# Patient Record
Sex: Female | Born: 1960
Health system: Southern US, Community
[De-identification: ages and names within clinical notes are randomized; demographics above are authoritative.]

## PROBLEM LIST (undated history)

## (undated) DIAGNOSIS — I10 Essential (primary) hypertension: Secondary | ICD-10-CM

## (undated) DIAGNOSIS — R0602 Shortness of breath: Secondary | ICD-10-CM

## (undated) DIAGNOSIS — E7800 Pure hypercholesterolemia, unspecified: Secondary | ICD-10-CM

## (undated) DIAGNOSIS — E039 Hypothyroidism, unspecified: Secondary | ICD-10-CM

## (undated) DIAGNOSIS — N979 Female infertility, unspecified: Secondary | ICD-10-CM

## (undated) DIAGNOSIS — L300 Nummular dermatitis: Secondary | ICD-10-CM

## (undated) HISTORY — DX: Shortness of breath: R06.02

## (undated) HISTORY — DX: Pure hypercholesterolemia, unspecified: E78.00

## (undated) HISTORY — DX: Nummular dermatitis: L30.0

## (undated) HISTORY — DX: Essential (primary) hypertension: I10

## (undated) HISTORY — DX: Female infertility, unspecified: N97.9

## (undated) HISTORY — DX: Hypothyroidism, unspecified: E03.9

## (undated) HISTORY — PX: MICROTUBOPLASTY: SHX5401

---

## 1988-12-13 HISTORY — PX: LAPAROSCOPY: SHX197

## 2016-01-09 ENCOUNTER — Other Ambulatory Visit (HOSPITAL_COMMUNITY)
Admission: RE | Admit: 2016-01-09 | Discharge: 2016-01-09 | Disposition: A | Discharge status: Home / Self Care | Payer: 59 | Source: Ambulatory Visit | Attending: Family Medicine | Admitting: Family Medicine

## 2016-01-09 DIAGNOSIS — Z1151 Encounter for screening for human papillomavirus (HPV): Secondary | ICD-10-CM | POA: Diagnosis not present

## 2016-01-09 DIAGNOSIS — Z124 Encounter for screening for malignant neoplasm of cervix: Secondary | ICD-10-CM | POA: Diagnosis present

## 2016-10-20 ENCOUNTER — Other Ambulatory Visit: Payer: Self-pay | Admitting: Family Medicine

## 2016-10-20 DIAGNOSIS — Z1231 Encounter for screening mammogram for malignant neoplasm of breast: Secondary | ICD-10-CM

## 2016-11-24 ENCOUNTER — Ambulatory Visit
Admission: RE | Admit: 2016-11-24 | Discharge: 2016-11-24 | Disposition: A | Discharge status: Home / Self Care | Payer: 59 | Source: Ambulatory Visit | Attending: Family Medicine | Admitting: Family Medicine

## 2016-11-24 DIAGNOSIS — Z1231 Encounter for screening mammogram for malignant neoplasm of breast: Secondary | ICD-10-CM

## 2018-02-10 ENCOUNTER — Other Ambulatory Visit: Payer: Self-pay | Admitting: Family Medicine

## 2018-05-10 DIAGNOSIS — Z780 Asymptomatic menopausal state: Secondary | ICD-10-CM | POA: Diagnosis not present

## 2018-05-10 LAB — HM DEXA SCAN: HM Dexa Scan: NORMAL

## 2018-07-13 DIAGNOSIS — E785 Hyperlipidemia, unspecified: Secondary | ICD-10-CM | POA: Diagnosis not present

## 2018-07-13 DIAGNOSIS — E039 Hypothyroidism, unspecified: Secondary | ICD-10-CM | POA: Diagnosis not present

## 2018-10-25 DIAGNOSIS — Z1231 Encounter for screening mammogram for malignant neoplasm of breast: Secondary | ICD-10-CM | POA: Diagnosis not present

## 2019-02-01 DIAGNOSIS — E039 Hypothyroidism, unspecified: Secondary | ICD-10-CM | POA: Diagnosis not present

## 2019-02-01 DIAGNOSIS — E785 Hyperlipidemia, unspecified: Secondary | ICD-10-CM | POA: Diagnosis not present

## 2019-02-01 DIAGNOSIS — I10 Essential (primary) hypertension: Secondary | ICD-10-CM | POA: Diagnosis not present

## 2019-02-09 DIAGNOSIS — Z0001 Encounter for general adult medical examination with abnormal findings: Secondary | ICD-10-CM | POA: Diagnosis not present

## 2019-02-09 DIAGNOSIS — I10 Essential (primary) hypertension: Secondary | ICD-10-CM | POA: Diagnosis not present

## 2019-02-09 DIAGNOSIS — E785 Hyperlipidemia, unspecified: Secondary | ICD-10-CM | POA: Diagnosis not present

## 2019-02-09 DIAGNOSIS — E039 Hypothyroidism, unspecified: Secondary | ICD-10-CM | POA: Diagnosis not present

## 2019-04-06 DIAGNOSIS — E785 Hyperlipidemia, unspecified: Secondary | ICD-10-CM | POA: Diagnosis not present

## 2019-06-07 DIAGNOSIS — L300 Nummular dermatitis: Secondary | ICD-10-CM | POA: Diagnosis not present

## 2019-06-07 DIAGNOSIS — L989 Disorder of the skin and subcutaneous tissue, unspecified: Secondary | ICD-10-CM | POA: Diagnosis not present

## 2019-06-07 DIAGNOSIS — L821 Other seborrheic keratosis: Secondary | ICD-10-CM | POA: Diagnosis not present

## 2019-06-07 DIAGNOSIS — D485 Neoplasm of uncertain behavior of skin: Secondary | ICD-10-CM | POA: Diagnosis not present

## 2019-06-07 DIAGNOSIS — L658 Other specified nonscarring hair loss: Secondary | ICD-10-CM | POA: Diagnosis not present

## 2019-07-09 DIAGNOSIS — L300 Nummular dermatitis: Secondary | ICD-10-CM | POA: Diagnosis not present

## 2019-07-12 DIAGNOSIS — E785 Hyperlipidemia, unspecified: Secondary | ICD-10-CM | POA: Diagnosis not present

## 2019-07-12 DIAGNOSIS — I10 Essential (primary) hypertension: Secondary | ICD-10-CM | POA: Diagnosis not present

## 2019-07-12 DIAGNOSIS — E039 Hypothyroidism, unspecified: Secondary | ICD-10-CM | POA: Diagnosis not present

## 2019-09-04 DIAGNOSIS — E785 Hyperlipidemia, unspecified: Secondary | ICD-10-CM | POA: Diagnosis not present

## 2019-09-04 DIAGNOSIS — E039 Hypothyroidism, unspecified: Secondary | ICD-10-CM | POA: Diagnosis not present

## 2019-09-07 DIAGNOSIS — E669 Obesity, unspecified: Secondary | ICD-10-CM | POA: Diagnosis not present

## 2019-09-07 DIAGNOSIS — E785 Hyperlipidemia, unspecified: Secondary | ICD-10-CM | POA: Diagnosis not present

## 2019-09-07 DIAGNOSIS — E039 Hypothyroidism, unspecified: Secondary | ICD-10-CM | POA: Diagnosis not present

## 2019-09-07 DIAGNOSIS — I10 Essential (primary) hypertension: Secondary | ICD-10-CM | POA: Diagnosis not present

## 2019-09-18 DIAGNOSIS — Z23 Encounter for immunization: Secondary | ICD-10-CM | POA: Diagnosis not present

## 2020-02-11 DIAGNOSIS — E039 Hypothyroidism, unspecified: Secondary | ICD-10-CM | POA: Diagnosis not present

## 2020-02-11 DIAGNOSIS — I10 Essential (primary) hypertension: Secondary | ICD-10-CM | POA: Diagnosis not present

## 2020-02-15 ENCOUNTER — Other Ambulatory Visit: Payer: Self-pay | Admitting: Family Medicine

## 2020-02-15 DIAGNOSIS — N644 Mastodynia: Secondary | ICD-10-CM

## 2020-02-15 DIAGNOSIS — M722 Plantar fascial fibromatosis: Secondary | ICD-10-CM | POA: Diagnosis not present

## 2020-02-15 DIAGNOSIS — H9193 Unspecified hearing loss, bilateral: Secondary | ICD-10-CM | POA: Diagnosis not present

## 2020-02-15 DIAGNOSIS — Z0000 Encounter for general adult medical examination without abnormal findings: Secondary | ICD-10-CM | POA: Diagnosis not present

## 2020-02-15 DIAGNOSIS — I10 Essential (primary) hypertension: Secondary | ICD-10-CM | POA: Diagnosis not present

## 2020-02-28 ENCOUNTER — Ambulatory Visit
Admission: RE | Admit: 2020-02-28 | Discharge: 2020-02-28 | Disposition: A | Discharge status: Home / Self Care | Payer: 59 | Source: Ambulatory Visit | Attending: Family Medicine | Admitting: Family Medicine

## 2020-02-28 ENCOUNTER — Ambulatory Visit: Payer: 59

## 2020-02-28 ENCOUNTER — Other Ambulatory Visit: Payer: Self-pay

## 2020-02-28 DIAGNOSIS — R928 Other abnormal and inconclusive findings on diagnostic imaging of breast: Secondary | ICD-10-CM | POA: Diagnosis not present

## 2020-02-28 DIAGNOSIS — N644 Mastodynia: Secondary | ICD-10-CM

## 2020-07-03 DIAGNOSIS — K047 Periapical abscess without sinus: Secondary | ICD-10-CM | POA: Diagnosis not present

## 2020-08-14 DIAGNOSIS — E785 Hyperlipidemia, unspecified: Secondary | ICD-10-CM | POA: Diagnosis not present

## 2020-08-14 DIAGNOSIS — E039 Hypothyroidism, unspecified: Secondary | ICD-10-CM | POA: Diagnosis not present

## 2020-08-14 DIAGNOSIS — I10 Essential (primary) hypertension: Secondary | ICD-10-CM | POA: Diagnosis not present

## 2020-08-22 DIAGNOSIS — Z23 Encounter for immunization: Secondary | ICD-10-CM | POA: Diagnosis not present

## 2020-08-22 DIAGNOSIS — I10 Essential (primary) hypertension: Secondary | ICD-10-CM | POA: Diagnosis not present

## 2020-08-22 DIAGNOSIS — E039 Hypothyroidism, unspecified: Secondary | ICD-10-CM | POA: Diagnosis not present

## 2020-08-22 DIAGNOSIS — E785 Hyperlipidemia, unspecified: Secondary | ICD-10-CM | POA: Diagnosis not present

## 2020-12-18 ENCOUNTER — Encounter (INDEPENDENT_AMBULATORY_CARE_PROVIDER_SITE_OTHER): Payer: Self-pay | Admitting: Family Medicine

## 2020-12-18 ENCOUNTER — Ambulatory Visit (INDEPENDENT_AMBULATORY_CARE_PROVIDER_SITE_OTHER): Payer: BC Managed Care – PPO | Admitting: Family Medicine

## 2020-12-18 ENCOUNTER — Other Ambulatory Visit: Payer: Self-pay

## 2020-12-18 VITALS — BP 130/83 | HR 72 | Temp 98.2°F | Ht 60.0 in | Wt 203.0 lb

## 2020-12-18 DIAGNOSIS — R5383 Other fatigue: Secondary | ICD-10-CM | POA: Insufficient documentation

## 2020-12-18 DIAGNOSIS — E7849 Other hyperlipidemia: Secondary | ICD-10-CM

## 2020-12-18 DIAGNOSIS — Z9189 Other specified personal risk factors, not elsewhere classified: Secondary | ICD-10-CM

## 2020-12-18 DIAGNOSIS — Z6839 Body mass index (BMI) 39.0-39.9, adult: Secondary | ICD-10-CM

## 2020-12-18 DIAGNOSIS — R0602 Shortness of breath: Secondary | ICD-10-CM | POA: Diagnosis not present

## 2020-12-18 DIAGNOSIS — Z1331 Encounter for screening for depression: Secondary | ICD-10-CM

## 2020-12-18 DIAGNOSIS — Z0289 Encounter for other administrative examinations: Secondary | ICD-10-CM

## 2020-12-18 DIAGNOSIS — E559 Vitamin D deficiency, unspecified: Secondary | ICD-10-CM | POA: Diagnosis not present

## 2020-12-18 DIAGNOSIS — E039 Hypothyroidism, unspecified: Secondary | ICD-10-CM | POA: Insufficient documentation

## 2020-12-18 DIAGNOSIS — I10 Essential (primary) hypertension: Secondary | ICD-10-CM

## 2020-12-18 DIAGNOSIS — E038 Other specified hypothyroidism: Secondary | ICD-10-CM

## 2020-12-18 DIAGNOSIS — E8881 Metabolic syndrome: Secondary | ICD-10-CM | POA: Diagnosis not present

## 2020-12-19 ENCOUNTER — Encounter (INDEPENDENT_AMBULATORY_CARE_PROVIDER_SITE_OTHER): Payer: Self-pay | Admitting: Family Medicine

## 2020-12-19 LAB — COMPREHENSIVE METABOLIC PANEL
ALT: 54 IU/L — ABNORMAL HIGH (ref 0–32)
AST: 104 IU/L — ABNORMAL HIGH (ref 0–40)
Albumin/Globulin Ratio: 1.4 (ref 1.2–2.2)
Albumin: 4.7 g/dL (ref 3.8–4.9)
Alkaline Phosphatase: 96 IU/L (ref 44–121)
BUN/Creatinine Ratio: 17 (ref 9–23)
BUN: 12 mg/dL (ref 6–24)
Bilirubin Total: 0.5 mg/dL (ref 0.0–1.2)
CO2: 22 mmol/L (ref 20–29)
Calcium: 10 mg/dL (ref 8.7–10.2)
Chloride: 102 mmol/L (ref 96–106)
Creatinine, Ser: 0.69 mg/dL (ref 0.57–1.00)
GFR calc Af Amer: 112 mL/min/{1.73_m2} (ref >59)
GFR calc non Af Amer: 97 mL/min/{1.73_m2} (ref >59)
Globulin, Total: 3.4 g/dL (ref 1.5–4.5)
Glucose: 92 mg/dL (ref 65–99)
Potassium: 4.2 mmol/L (ref 3.5–5.2)
Sodium: 139 mmol/L (ref 134–144)
Total Protein: 8.1 g/dL (ref 6.0–8.5)

## 2020-12-19 LAB — CBC WITH DIFFERENTIAL/PLATELET
Basophils Absolute: 0 10*3/uL (ref 0.0–0.2)
Basos: 1 %
EOS (ABSOLUTE): 0.1 10*3/uL (ref 0.0–0.4)
Eos: 2 %
Hematocrit: 42.6 % (ref 34.0–46.6)
Hemoglobin: 14.9 g/dL (ref 11.1–15.9)
Immature Grans (Abs): 0 10*3/uL (ref 0.0–0.1)
Immature Granulocytes: 0 %
Lymphocytes Absolute: 2.1 10*3/uL (ref 0.7–3.1)
Lymphs: 35 %
MCH: 31.7 pg (ref 26.6–33.0)
MCHC: 35 g/dL (ref 31.5–35.7)
MCV: 91 fL (ref 79–97)
Monocytes Absolute: 0.4 10*3/uL (ref 0.1–0.9)
Monocytes: 6 %
Neutrophils Absolute: 3.3 10*3/uL (ref 1.4–7.0)
Neutrophils: 56 %
Platelets: 280 10*3/uL (ref 150–450)
RBC: 4.7 x10E6/uL (ref 3.77–5.28)
RDW: 13 % (ref 11.7–15.4)
WBC: 5.9 10*3/uL (ref 3.4–10.8)

## 2020-12-19 LAB — VITAMIN B12: Vitamin B-12: 802 pg/mL (ref 232–1245)

## 2020-12-19 LAB — LIPID PANEL
Chol/HDL Ratio: 2.9 ratio (ref 0.0–4.4)
Cholesterol, Total: 215 mg/dL — ABNORMAL HIGH (ref 100–199)
HDL: 75 mg/dL (ref >39)
LDL Chol Calc (NIH): 126 mg/dL — ABNORMAL HIGH (ref 0–99)
Triglycerides: 82 mg/dL (ref 0–149)
VLDL Cholesterol Cal: 14 mg/dL (ref 5–40)

## 2020-12-19 LAB — HEMOGLOBIN A1C
Est. average glucose Bld gHb Est-mCnc: 111 mg/dL
Hgb A1c MFr Bld: 5.5 % (ref 4.8–5.6)

## 2020-12-19 LAB — T4, FREE: Free T4: 1.4 ng/dL (ref 0.82–1.77)

## 2020-12-19 LAB — TSH: TSH: 7.25 u[IU]/mL — ABNORMAL HIGH (ref 0.450–4.500)

## 2020-12-19 LAB — INSULIN, RANDOM: INSULIN: 8.6 u[IU]/mL (ref 2.6–24.9)

## 2020-12-19 LAB — VITAMIN D 25 HYDROXY (VIT D DEFICIENCY, FRACTURES): Vit D, 25-Hydroxy: 35.7 ng/mL (ref 30.0–100.0)

## 2020-12-19 LAB — FOLATE: Folate: 12.5 ng/mL (ref >3.0)

## 2020-12-19 LAB — T3: T3, Total: 100 ng/dL (ref 71–180)

## 2020-12-23 NOTE — Progress Notes (Addendum)
Dear Dr. Gweneth Gonzales,   Thank you for referring Sylvia Gonzales to our clinic. The following note includes my evaluation and treatment recommendations.  Chief Complaint:   OBESITY Sylvia Gonzales (MR# [id number]) is a 60 y.o. female who presents for evaluation and treatment of obesity and related comorbidities. Current BMI is Body mass index is 39.65 kg/m. Sylvia Gonzales has been struggling with her weight for many years and has been unsuccessful in either losing weight, maintaining weight loss, or reaching her healthy weight goal.  Sylvia Gonzales is currently in the action stage of change and ready to dedicate time achieving and maintaining a healthier weight. Sylvia Gonzales is interested in becoming our patient and working on intensive lifestyle modifications including (but not limited to) diet and exercise for weight loss.  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 Her husband works from home, and she says her #1 job is to take care of him and the house.  Sylvia Gonzales's habits were reviewed today and are as follows: Her family eats meals together, she thinks her family will eat healthier with her, her desired weight loss is 53 lbs, she has been heavy most of her life, she started gaining weight in her late 20's, her heaviest weight ever was 207 pounds, she has significant food cravings issues, she snacks frequently in the evenings, she skips meals frequently, she is frequently drinking liquids with calories, she frequently makes poor food choices and she struggles with emotional eating.  This is the patient's first visit at [facility].  The patient's NEW PATIENT PACKET that they filled out prior to today's office visit was reviewed at length and information from that paperwork was included within the following office visit note.    Included in the packet: current and past health history, medications, allergies, ROS, gynecologic history (women  only), surgical history, family history, social history, weight history, weight loss surgery history (for those that have had weight loss surgery), nutritional evaluation, mood and food questionnaire along with a depression screening (PHQ9) on all patients, an Epworth questionnaire, sleep habits questionnaire, patient life and health improvement goals questionnaire. These will all be scanned into the patient's chart under media.   During the visit, I independently reviewed the patient's EKG, bioimpedance scale results, and indirect calorimeter results. I used this information to tailor a meal plan for the patient that will help Sylvia Gonzales to lose weight and will improve her obesity-related conditions going forward.  I performed a medically necessary appropriate examination and/or evaluation. I discussed the assessment and treatment plan with the patient. The patient was provided an opportunity to ask questions and all were answered. The patient agreed with the plan and demonstrated an understanding of the instructions. Labs were ordered today (unless patient declined them) and will be reviewed with the patient at our next visit unless more critical results need to be addressed immediately. Clinical information was updated and documented in the EMR.  Time spent on visit including pre-visit chart review and post-visit care was estimated to be 60 minutes.  A separate 15 minutes was spent on risk counseling (see above/below).   Depression Screen Sylvia Gonzales's Food and Mood (modified PHQ-9) score was 2.  Depression screen Sylvia Gonzales 2/9 12/18/2020  Decreased Interest 0  Down, Depressed, Hopeless 0  PHQ - 2 Score 0  Altered sleeping 0  Tired, decreased energy 1  Change in appetite 1  Feeling bad or failure about yourself  0  Trouble concentrating 0  Moving slowly or fidgety/restless 0  Suicidal thoughts 0  PHQ-9 Score 2    Assessment/Plan:   1. Other fatigue Sylvia Gonzales admits to daytime somnolence and admits to waking up  still tired. Patent has a history of symptoms of daytime fatigue. Sylvia Gonzales generally gets 8 hours of sleep per night, and states that she has generally restful sleep. Snoring is present. Apneic episodes are not present. Epworth Sleepiness Score is 5.  Plan: [name] does feel that her weight is causing her energy to be lower than it should be. Fatigue may be related to obesity, depression or many other causes. Labs will be ordered, and in the meanwhile, [name] will focus on self care including making healthy food choices, increasing physical activity and focusing on stress reduction. Check labs today.  Orders - EKG 12-Lead - Vitamin B12 - CBC with Differential/Platelet - Comprehensive metabolic panel - Folate - Hemoglobin A1c - Insulin, random - Lipid panel - T3 - T4, free - TSH - VITAMIN D 25 Hydroxy (Vit-D Deficiency, Fractures)  2. SOBOE (shortness of breath on exertion) Sylvia Gonzales notes increasing shortness of breath with exercising and seems to be worsening over time with weight gain. She notes getting out of breath sooner with activity than she used to. This has gotten worse recently. Sylvia Gonzales denies shortness of breath at rest or orthopnea.  [name] does feel that she gets out of breath more easily that she used to when she exercises. [name]'s shortness of breath appears to be obesity related and exercise induced. She has agreed to work on weight loss and gradually increase exercise to treat her exercise induced shortness of breath. Will continue to monitor closely. Check labs today.  Orders - Vitamin B12 - CBC with Differential/Platelet - Comprehensive metabolic panel - Folate - Hemoglobin A1c - Insulin, random - Lipid panel - T3 - T4, free - TSH - VITAMIN D 25 Hydroxy (Vit-D Deficiency, Fractures)  3. Essential hypertension Sylvia Gonzales is prescribed candesartan 16 mg. Review: taking medications as instructed, no medication side effects noted, no chest pain on exertion, no dyspnea on exertion,  no swelling of ankles.   BP Readings from Last 3 Encounters:  01/01/21 134/83  12/18/20 130/83   Plan: Triva's blood pressure is essentially at goal. She is working on healthy weight loss and exercise to improve blood pressure control. We will watch for signs of hypotension as she continues her lifestyle modifications. Check labs today.  Orders - Vitamin B12 - CBC with Differential/Platelet - Comprehensive metabolic panel - Folate - Hemoglobin A1c - Insulin, random - Lipid panel - T3 - T4, free - TSH - VITAMIN D 25 Hydroxy (Vit-D Deficiency, Fractures)  4. Other hyperlipidemia Sylvia Gonzales is prescribed Lipitor 80 mg. She has hyperlipidemia and has been trying to improve her cholesterol levels with intensive lifestyle modification including a low saturated fat diet, exercise and weight loss. She denies any chest pain, claudication or myalgias.  Plan: Cardiovascular risk and specific lipid/LDL goals reviewed.  We discussed several lifestyle modifications today and Danilyn will continue to work on diet, exercise and weight loss efforts. Orders and follow up as documented in patient record. Check labs today.  Counseling Intensive lifestyle modifications are the first line treatment for this issue. . Dietary changes: Increase soluble fiber. Decrease simple carbohydrates. . Exercise changes: Moderate to vigorous-intensity aerobic activity 150 minutes per week if tolerated. . Lipid-lowering medications: see documented in medical record.   Orders - Vitamin B12 - CBC with Differential/Platelet - Comprehensive metabolic  panel - Folate - Hemoglobin A1c - Insulin, random - Lipid panel - T3 - T4, free - TSH - VITAMIN D 25 Hydroxy (Vit-D Deficiency, Fractures)  5. Other specified hypothyroidism The patient is prescribed Synthroid 112 mcg daily. Currently without concerns or symptoms.  Plan:  Continue current medication at current dose for now.   Check labs.  Orders and follow up as  documented in patient record.  Counseling . Good thyroid control is important for overall health. Supratherapeutic thyroid levels are dangerous and will not improve weight loss results. . Counseling: The correct way to take levothyroxine is fasting, with water, separated by at least 30 minutes from breakfast, and separated by more than 4 hours from calcium, iron, multivitamins, acid reflux medications (PPIs).   Orders - Vitamin B12 - CBC with Differential/Platelet - Comprehensive metabolic panel - Folate - Hemoglobin A1c - Insulin, random - Lipid panel - T3 - T4, free - TSH - VITAMIN D 25 Hydroxy (Vit-D Deficiency, Fractures)  6. Vitamin D deficiency Sylvia Gonzales is currently taking OTC vitamin D 1000 units each day. She denies nausea, vomiting or muscle weakness.  Plan: Low Vitamin D level contributes to fatigue and are associated with obesity, breast, and colon cancer.  Check labs today.  Orders - Vitamin B12 - CBC with Differential/Platelet - Comprehensive metabolic panel - Folate - Hemoglobin A1c - Insulin, random - Lipid panel - T3 - T4, free - TSH - VITAMIN D 25 Hydroxy (Vit-D Deficiency, Fractures)  7. At risk for diabetes mellitus - Sylvia Gonzales's father was diabetic around her age. Sylvia Gonzales was given extensive diabetes prevention education and counseling today of more than 28 minutes.  - Counseled patient on pathophysiology of disease and discussed various treatment options which always includes dietary and lifestyle modification as first line.   - Importance of healthy diet with very limited amounts of simple carbohydrates discussed with patient in addition to regular aerobic exercise to an eventual goal of 30min 5d/week or more.  - Handouts provided at patient's desire and or told to go online at the American Diabetes Association website for further information  8. Depression screening  Sylvia Gonzales had an essentially negative depression screening. Depression is commonly associated  with obesity and often results in emotional eating behaviors. We will monitor this closely and work to help improve the non-hunger eating patterns. Referral to Psychology may be required if no improvement is seen as she continues in our clinic.  9. Class 2 severe obesity with serious comorbidity and body mass index (BMI) of 39.0 to 39.9 in adult, unspecified obesity type (HCC) Sylvia Gonzales is currently in the action stage of change and her goal is to continue with weight loss efforts. I recommend Sylvia Gonzales begin the structured treatment plan as follows:  She has agreed to the Category 1 Plan.  Exercise goals: As is   Behavioral modification strategies: increasing lean protein intake, decreasing simple carbohydrates, increasing water intake, decreasing liquid calories (decrease alochol intake), meal planning and cooking strategies, keeping healthy foods in the home and planning for success.  She was informed of the importance of frequent follow-up visits to maximize her success with intensive lifestyle modifications for her multiple health conditions. She was informed we would discuss her lab results at her next visit unless there is a critical issue that needs to be addressed sooner. Sylvia Gonzales agreed to keep her next visit at the agreed upon time to discuss these results.  Objective:   Blood pressure 130/83, pulse 72, temperature 98.2 F (36.8 C),  height 5' (1.524 m), weight 203 lb (92.1 kg), SpO2 97 %. Body mass index is 39.65 kg/m.  EKG: Normal sinus rhythm, rate 76.  Indirect Calorimeter completed today shows a VO2 of 211 and a REE of 1473.  Her calculated basal metabolic rate is 2836 thus her basal metabolic rate is worse than expected.  General: Cooperative, alert, well developed, in no acute distress. HEENT: Conjunctivae and lids unremarkable. Cardiovascular: Regular rhythm.  Lungs: Normal work of breathing. Neurologic: No focal deficits.   Lab Results  Component Value Date   CREATININE 0.69  12/18/2020   BUN 12 12/18/2020   NA 139 12/18/2020   K 4.2 12/18/2020   CL 102 12/18/2020   CO2 22 12/18/2020   Lab Results  Component Value Date   ALT 54 (H) 12/18/2020   AST 104 (H) 12/18/2020   ALKPHOS 96 12/18/2020   BILITOT 0.5 12/18/2020   Lab Results  Component Value Date   HGBA1C 5.5 12/18/2020   Lab Results  Component Value Date   INSULIN 8.6 12/18/2020   Lab Results  Component Value Date   TSH 7.250 (H) 12/18/2020   Lab Results  Component Value Date   CHOL 215 (H) 12/18/2020   HDL 75 12/18/2020   LDLCALC 126 (H) 12/18/2020   TRIG 82 12/18/2020   CHOLHDL 2.9 12/18/2020   Lab Results  Component Value Date   WBC 5.9 12/18/2020   HGB 14.9 12/18/2020   HCT 42.6 12/18/2020   MCV 91 12/18/2020   PLT 280 12/18/2020    Attestation Statements:   Reviewed by clinician on day of visit: allergies, medications, problem list, medical history, surgical history, family history, social history, and previous encounter notes.  Edmund Hilda, am acting as Energy manager for [employer], DO.  I have reviewed the above documentation for accuracy and completeness, and I agree with the above. Carlye Grippe, D.O.  The 21st Century Cures Act was signed into law in 2016 which includes the topic of electronic health records.  This provides immediate access to information in MyChart.  This includes consultation notes, operative notes, office notes, lab results and pathology reports.  If you have any questions about what you read please let us know at your next visit so we can discuss your concerns and take corrective action if need be.  We are right here with you.

## 2020-12-30 ENCOUNTER — Encounter (INDEPENDENT_AMBULATORY_CARE_PROVIDER_SITE_OTHER): Payer: Self-pay | Admitting: Family Medicine

## 2021-01-01 ENCOUNTER — Encounter (INDEPENDENT_AMBULATORY_CARE_PROVIDER_SITE_OTHER): Payer: Self-pay | Admitting: Family Medicine

## 2021-01-01 ENCOUNTER — Other Ambulatory Visit: Payer: Self-pay

## 2021-01-01 ENCOUNTER — Ambulatory Visit (INDEPENDENT_AMBULATORY_CARE_PROVIDER_SITE_OTHER): Payer: BC Managed Care – PPO | Admitting: Family Medicine

## 2021-01-01 VITALS — BP 134/83 | HR 77 | Temp 97.9°F | Ht 60.0 in | Wt 203.0 lb

## 2021-01-01 DIAGNOSIS — E7849 Other hyperlipidemia: Secondary | ICD-10-CM | POA: Diagnosis not present

## 2021-01-01 DIAGNOSIS — Z6839 Body mass index (BMI) 39.0-39.9, adult: Secondary | ICD-10-CM

## 2021-01-01 DIAGNOSIS — E559 Vitamin D deficiency, unspecified: Secondary | ICD-10-CM

## 2021-01-01 DIAGNOSIS — Z9189 Other specified personal risk factors, not elsewhere classified: Secondary | ICD-10-CM

## 2021-01-01 DIAGNOSIS — E8881 Metabolic syndrome: Secondary | ICD-10-CM | POA: Diagnosis not present

## 2021-01-01 DIAGNOSIS — E038 Other specified hypothyroidism: Secondary | ICD-10-CM

## 2021-01-01 DIAGNOSIS — R748 Abnormal levels of other serum enzymes: Secondary | ICD-10-CM | POA: Diagnosis not present

## 2021-01-01 MED ORDER — VITAMIN D (ERGOCALCIFEROL) 1.25 MG (50000 UNIT) PO CAPS: 50000.0000 [IU] | ORAL_CAPSULE | ORAL | 0 refills | Status: DC

## 2021-01-05 NOTE — Progress Notes (Signed)
Chief Complaint:   OBESITY Sylvia Gonzales is here to discuss her progress with her obesity treatment plan along with follow-up of her obesity related diagnoses. Sylvia Gonzales is on the Category 1 Plan and states she is following her eating plan approximately 80% of the time. Sylvia Gonzales states she is walking 45 minutes 3 times per week.  Today's visit was #: 2 Starting weight: 203 lbs Starting date: 12/18/2020 Today's weight: 203 lbs Today's date: 01/01/2021 Total lbs lost to date: 0 Total lbs lost since last in-office visit: 0  Interim History: Sylvia Gonzales is here today to review her NEW Meal Plan and to discuss all recent labs done here and/ or done at outside facilities.  Extended time was spent counseling Sylvia Gonzales on all new disease processes that were discovered or that are worsening.   Pt notes hunger and cravings are controlled. She is drinking more water than she ever has and she feels this helps her stay full. She denies issues with the plan. Occasionally not getting in her protein and eating excess vegetables.  Assessment/Plan:   1. Elevated liver enzymes Worsening. Discussed labs with patient today. Pt does drink beer and or wine 1-2 most days, more on weekends. Since labs were seen, pt tells me she only drank a couple of times in the past 2 weeks. No history of elevated liver enzymes at PCP's office.  Lab Results  Component Value Date   ALT 54 (H) 12/18/2020   AST 104 (H) 12/18/2020   ALKPHOS 96 12/18/2020   BILITOT 0.5 12/18/2020   Plan:  1. Follow up with PCP regarding abnormal lab and possible further work up.  2. No ETOH, tylenol and decrease fatty meats, etc 3. Repeat labs her or with PCP 2-3 months after abstinence from hepatotoxic substances.   2. Other hyperlipidemia Worsening. Discussed labs with patient today. Pt denies issues with statin. Compliance with meds is good; on Lipitor 80 mg at bedtime. Elevated LDL, HDL very good and triglycerides are within normal  limits.  Lab Results  Component Value Date   ALT 54 (H) 12/18/2020   AST 104 (H) 12/18/2020   ALKPHOS 96 12/18/2020   BILITOT 0.5 12/18/2020   Lab Results  Component Value Date   CHOL 215 (H) 12/18/2020   HDL 75 12/18/2020   LDLCALC 126 (H) 12/18/2020   TRIG 82 12/18/2020   CHOLHDL 2.9 12/18/2020   Plan: Pt's LDL is still elevated. I recommend she follow up with PCP to discuss whether to change to Crestor or other, if warranted.  For [place]: decrease saturated and rans fats. Increase exercise and will recheck in 3-4 months, after weight loss occurs.   3. Insulin resistance New. Discussed labs with patient today. Pt reports no history of being told she is insulin resistant in the past. She is not on medication currently and no history of being on meds for this.  Lab Results  Component Value Date   INSULIN 8.6 12/18/2020   Lab Results  Component Value Date   HGBA1C 5.5 12/18/2020   Plan: Extensive counseling done. Handouts given to pt. Decrease simple carbs, weight loss to prevent progression to DM. Consider meds in the future prn.  4. Other specified hypothyroidism Discussed labs with patient today. Pt is asymptomatic and without concerns for sub-therapuetic treatment on Synthroid 112 mcg daily. Per pt, not consistent with meds and doesn't take fasting and at times takes with meals, etc.   Ref. Range 12/18/2020 13:22  TSH Latest Ref Range:  0.450 - 4.500 uIU/mL 7.250 (H)  Triiodothyronine (T3) Latest Ref Range: 71 - 180 ng/dL 335  K5,GYBW(LSLHTD) Latest Ref Range: 0.82 - 1.77 ng/dL 4.28    Plan: Discussed with pt elevated TSH but not T3 and Free T4. No medication dose change needed. We discussed when and how to take her Synthroid extensively.  5. Vitamin D deficiency New. Discussed labs with patient today. Sylvia Gonzales's Vitamin D level was 35.7 on 12/18/2020. She is currently taking no vitamin D supplement. She denies nausea, vomiting or muscle weakness. No history of Vit D deficiency. Pt  is not on meds or supplements. She reports some fatigue. No history of bone density scan. No history of osteopenia or osteoporosis.   Ref. Range 12/18/2020 13:22  Vitamin D, 25-Hydroxy Latest Ref Range: 30.0 - 100.0 ng/mL 35.7   Plan: Start prescription Vit D. - Discussed importance of vitamin D to their health and well-being.  - possible symptoms of low Vitamin D can be low energy, depressed mood, muscle aches, joint aches, osteoporosis etc. - low Vitamin D levels may be linked to an increased risk of cardiovascular events and even increased risk of cancers- such as colon and breast.  - I recommend pt take a weekly prescription vit D - see script below   - Informed patient this may be a lifelong thing, and she was encouraged to continue to take the medicine until told otherwise.   - we will need to monitor levels regularly (every 3-4 mo on average) to keep levels within normal limits.  - weight loss will likely improve availability of vitamin D, thus encouraged Sylvia Gonzales to continue with meal plan and their weight loss efforts to further improve this condition - pt's questions and concerns regarding this condition addressed.  Start- Vitamin D, Ergocalciferol, (DRISDOL) 1.25 MG (50000 UNIT) CAPS capsule; Take 1 capsule (50,000 Units total) by mouth every 7 (seven) days.  Dispense: 4 capsule; Refill: 0  6. At risk for diabetes mellitus - Sylvia Gonzales was given extensive diabetes prevention education and counseling today of more than 26 minutes.  - Counseled patient on pathophysiology of disease and discussed various treatment options which always includes dietary and lifestyle modification as first line.   - Importance of healthy diet with very limited amounts of simple carbohydrates discussed with patient in addition to regular aerobic exercise to an eventual goal of 5d/week or more.  - Handouts provided at patient's desire and or told to go online at the American Diabetes Association website for  further information  7. Class 2 severe obesity with serious comorbidity and body mass index (BMI) of 39.0 to 39.9 in adult, unspecified obesity type (HCC) Sylvia Gonzales is currently in the action stage of change. As such, her goal is to continue with weight loss efforts. She has agreed to the Category 1 Plan.   Exercise goals: As is  Behavioral modification strategies: increasing lean protein intake, decreasing simple carbohydrates, no skipping meals, meal planning and cooking strategies, keeping healthy foods in the home, better snacking choices and planning for success.  Sylvia Gonzales has agreed to follow-up with our clinic in 2 weeks. She was informed of the importance of frequent follow-up visits to maximize her success with intensive lifestyle modifications for her multiple health conditions.   Objective:   Blood pressure 134/83, pulse 77, temperature 97.9 F (36.6 C), height 5' (1.524 m), weight 203 lb (92.1 kg), SpO2 95 %. Body mass index is 39.65 kg/m.  General: Cooperative, alert, well developed, in no acute distress.  HEENT: Conjunctivae and lids unremarkable. Cardiovascular: Regular rhythm.  Lungs: Normal work of breathing. Neurologic: No focal deficits.   Lab Results  Component Value Date   CREATININE 0.69 12/18/2020   BUN 12 12/18/2020   NA 139 12/18/2020   K 4.2 12/18/2020   CL 102 12/18/2020   CO2 22 12/18/2020   Lab Results  Component Value Date   ALT 54 (H) 12/18/2020   AST 104 (H) 12/18/2020   ALKPHOS 96 12/18/2020   BILITOT 0.5 12/18/2020   Lab Results  Component Value Date   HGBA1C 5.5 12/18/2020   Lab Results  Component Value Date   INSULIN 8.6 12/18/2020   Lab Results  Component Value Date   TSH 7.250 (H) 12/18/2020   Lab Results  Component Value Date   CHOL 215 (H) 12/18/2020   HDL 75 12/18/2020   LDLCALC 126 (H) 12/18/2020   TRIG 82 12/18/2020   CHOLHDL 2.9 12/18/2020   Lab Results  Component Value Date   WBC 5.9 12/18/2020   HGB 14.9 12/18/2020    HCT 42.6 12/18/2020   MCV 91 12/18/2020   PLT 280 12/18/2020   No results found for: IRON, TIBC, FERRITIN   Attestation Statements:   Reviewed by clinician on day of visit: allergies, medications, problem list, medical history, surgical history, family history, social history, and previous encounter notes.  Edmund Hilda, am acting as Energy manager for [employer], DO.  I have reviewed the above documentation for accuracy and completeness, and I agree with the above. Carlye Grippe, D.O.  The 21st Century Cures Act was signed into law in 2016 which includes the topic of electronic health records.  This provides immediate access to information in MyChart.  This includes consultation notes, operative notes, office notes, lab results and pathology reports.  If you have any questions about what you read please let us know at your next visit so we can discuss your concerns and take corrective action if need be.  We are right here with you.

## 2021-01-07 ENCOUNTER — Encounter (INDEPENDENT_AMBULATORY_CARE_PROVIDER_SITE_OTHER): Payer: Self-pay | Admitting: Family Medicine

## 2021-01-14 ENCOUNTER — Other Ambulatory Visit: Payer: Self-pay | Admitting: Family Medicine

## 2021-01-14 DIAGNOSIS — Z1231 Encounter for screening mammogram for malignant neoplasm of breast: Secondary | ICD-10-CM

## 2021-01-15 ENCOUNTER — Other Ambulatory Visit: Payer: Self-pay

## 2021-01-15 ENCOUNTER — Encounter (INDEPENDENT_AMBULATORY_CARE_PROVIDER_SITE_OTHER): Payer: Self-pay | Admitting: Family Medicine

## 2021-01-15 ENCOUNTER — Ambulatory Visit (INDEPENDENT_AMBULATORY_CARE_PROVIDER_SITE_OTHER): Payer: BC Managed Care – PPO | Admitting: Family Medicine

## 2021-01-15 VITALS — BP 137/88 | HR 68 | Temp 97.8°F | Ht 60.0 in | Wt 200.0 lb

## 2021-01-15 DIAGNOSIS — Z9189 Other specified personal risk factors, not elsewhere classified: Secondary | ICD-10-CM

## 2021-01-15 DIAGNOSIS — Z6839 Body mass index (BMI) 39.0-39.9, adult: Secondary | ICD-10-CM | POA: Diagnosis not present

## 2021-01-15 DIAGNOSIS — E7849 Other hyperlipidemia: Secondary | ICD-10-CM

## 2021-01-20 NOTE — Progress Notes (Signed)
Chief Complaint:   OBESITY Sylvia Gonzales is here to discuss her progress with her obesity treatment plan along with follow-up of her obesity related diagnoses.   Today's visit was #: 3 Starting weight: 203 lbs Starting date: 12/18/2020 Today's weight: 200 lbs Today's date: 01/15/2021 Total lbs lost to date: 3 lbs Body mass index is 39.06 kg/m.  Total weight loss percentage to date: -1.48%  Interim History: Sylvia Gonzales has increased her water intake by 2-3 times now, she is proud of herself  Sylvia Gonzales is here for a follow up office visit.  she is following the meal plan without concern or issues.  Patient's meal and food recall appears to be accurate and consistent with what is on the plan.  When on plan, her hunger and cravings are well controlled.    Nutrition Plan: Category 1 Plan for 85% of the time. Activity: Walking for 45 minutes 3 times per week.  Assessment/Plan:    1. Other hyperlipidemia Course: Not at goal. Lipid-lowering medications: Lipitor 80 mg daily.  She did not have a chance to speak with her PCP regarding change to Crestor or PCSK-9 inhibitor.  Plan:  Continue prudent nutritional plan, increase exercise.  Follow-up with PCP regarding possible changes to medication therapy  Dietary changes: Increase soluble fiber, decrease simple carbohydrates, decrease saturated fat. Exercise changes: Moderate to vigorous-intensity aerobic activity 150 minutes per week or as tolerated. We will continue to monitor along with PCP/specialists as it pertains to her weight loss journey.  Lab Results  Component Value Date   CHOL 215 (H) 12/18/2020   HDL 75 12/18/2020   LDLCALC 126 (H) 12/18/2020   TRIG 82 12/18/2020   CHOLHDL 2.9 12/18/2020   The 10-year ASCVD risk score Denman George DC Jr., et al., 2013) is: 3.1%   Values used to calculate the score:     Age: 60 years     Sex: Female     Is Non-Hispanic African American: No     Diabetic: No     Tobacco smoker: No     Systolic Blood  Pressure: 137 mmHg     Is BP treated: Yes     HDL Cholesterol: 75 mg/dL     Total Cholesterol: 215 mg/dL   2. At risk for activity intolerance Sylvia Gonzales was given approximately 15 minutes of counseling today regarding her increased risk for exercise intolerance.  We discussed patient's specific personal and medical issues that raise our concern.  She was advised of strategies to prevent injury and ways to improve her cardiopulmonary fitness levels slowly over time.  We additionally discussed various fitness trackers and smart phone apps to help motivate patient to stay on track as well.    3. Class 2 severe obesity with serious comorbidity and body mass index (BMI) of 39.0 to 39.9 in adult, unspecified obesity type (HCC)  Course: Sylvia Gonzales is currently in the action stage of change. As such, her goal is to continue with weight loss efforts.   Nutrition goals: She has agreed to the Category 1 Plan.   Exercise goals: For substantial health benefits, adults should do at least 150 minutes (2 hours and 30 minutes) a week of moderate-intensity, or 75 minutes (1 hour and 15 minutes) a week of vigorous-intensity aerobic physical activity, or an equivalent combination of moderate- and vigorous-intensity aerobic activity. Aerobic activity should be performed in episodes of at least 10 minutes, and preferably, it should be spread throughout the week. Plus ADD strength training for 2 days  per week.  Behavioral modification strategies: increasing lean protein intake, increasing water intake, meal planning and cooking strategies, keeping healthy foods in the home, avoiding temptations and planning for success.  [name] has agreed to follow-up with our clinic in 2 weeks. She was informed of the importance of frequent follow-up visits to maximize her success with intensive lifestyle modifications for her multiple health conditions.   Objective:   Blood pressure 137/88, pulse 68, temperature 97.8 F (36.6 C), height  5' (1.524 m), weight 200 lb (90.7 kg), SpO2 97 %. Body mass index is 39.06 kg/m.  General: Cooperative, alert, well developed, in no acute distress. HEENT: Conjunctivae and lids unremarkable. Cardiovascular: Regular rhythm.  Lungs: Normal work of breathing. Neurologic: No focal deficits.   Lab Results  Component Value Date   CREATININE 0.69 12/18/2020   BUN 12 12/18/2020   NA 139 12/18/2020   K 4.2 12/18/2020   CL 102 12/18/2020   CO2 22 12/18/2020   Lab Results  Component Value Date   ALT 54 (H) 12/18/2020   AST 104 (H) 12/18/2020   ALKPHOS 96 12/18/2020   BILITOT 0.5 12/18/2020   Lab Results  Component Value Date   HGBA1C 5.5 12/18/2020   Lab Results  Component Value Date   INSULIN 8.6 12/18/2020   Lab Results  Component Value Date   TSH 7.250 (H) 12/18/2020   Lab Results  Component Value Date   CHOL 215 (H) 12/18/2020   HDL 75 12/18/2020   LDLCALC 126 (H) 12/18/2020   TRIG 82 12/18/2020   CHOLHDL 2.9 12/18/2020   Lab Results  Component Value Date   WBC 5.9 12/18/2020   HGB 14.9 12/18/2020   HCT 42.6 12/18/2020   MCV 91 12/18/2020   PLT 280 12/18/2020   Attestation Statements:   Reviewed by clinician on day of visit: allergies, medications, problem list, medical history, surgical history, family history, social history, and previous encounter notes.  I, Insurance claims handler, CMA, am acting as Energy manager for [employer], DO.  I have reviewed the above documentation for accuracy and completeness, and I agree with the above. -  *Sylvia Gonzales, D.O.  The 21st Century Cures Act was signed into law in 2016 which includes the topic of electronic health records.  This provides immediate access to information in MyChart.  This includes consultation notes, operative notes, office notes, lab results and pathology reports.  If you have any questions about what you read please let us know at your next visit so we can discuss your concerns and take corrective  action if need be.  We are right here with you.

## 2021-02-02 ENCOUNTER — Other Ambulatory Visit (INDEPENDENT_AMBULATORY_CARE_PROVIDER_SITE_OTHER): Payer: Self-pay | Admitting: Family Medicine

## 2021-02-02 ENCOUNTER — Other Ambulatory Visit: Payer: Self-pay

## 2021-02-02 ENCOUNTER — Encounter (INDEPENDENT_AMBULATORY_CARE_PROVIDER_SITE_OTHER): Payer: Self-pay | Admitting: Family Medicine

## 2021-02-02 ENCOUNTER — Ambulatory Visit (INDEPENDENT_AMBULATORY_CARE_PROVIDER_SITE_OTHER): Payer: BC Managed Care – PPO | Admitting: Family Medicine

## 2021-02-02 VITALS — BP 132/87 | HR 63 | Temp 97.9°F | Ht 60.0 in | Wt 197.0 lb

## 2021-02-02 DIAGNOSIS — I10 Essential (primary) hypertension: Secondary | ICD-10-CM | POA: Diagnosis not present

## 2021-02-02 DIAGNOSIS — Z9189 Other specified personal risk factors, not elsewhere classified: Secondary | ICD-10-CM

## 2021-02-02 DIAGNOSIS — Z6838 Body mass index (BMI) 38.0-38.9, adult: Secondary | ICD-10-CM

## 2021-02-02 DIAGNOSIS — E559 Vitamin D deficiency, unspecified: Secondary | ICD-10-CM

## 2021-02-02 MED ORDER — VITAMIN D (ERGOCALCIFEROL) 1.25 MG (50000 UNIT) PO CAPS
50000.0000 [IU] | ORAL_CAPSULE | ORAL | 0 refills | Status: DC
Start: 2021-02-02 — End: 2021-02-17

## 2021-02-05 NOTE — Progress Notes (Signed)
Chief Complaint:   OBESITY Sylvia Gonzales is here to discuss her progress with her obesity treatment plan along with follow-up of her obesity related diagnoses.   Today's visit was #: 4 Starting weight: 203 lbs Starting date: 12/18/2020 Today's weight: 197 lbs Today's date: 02/02/2021 Total lbs lost to date: 6 lbs Body mass index is 38.47 kg/m.  Total weight loss percentage to date: -2.96%  Interim History:  Sylvia Gonzales is happy that she made it through the Super Bowl and Valentine's Day and stuck to the plan and lost weight.  Denies hunger or cravings.  She increased walking from 3 days per week to 4 days per week for 40-50 minutes.  She has also increased the amount of yoga she is doing.  Current Meal Plan: the Category 1 Plan for 75% of the time.  Current Exercise Plan:  Walking, yoga, YouTube for 50 minutes 4 times per week.  This patient is following the prescribed meal plan meal without concerns.  Food recall appears to be consistent with the prescribed plan.  When following the plan, hunger and cravings are well controlled.    Assessment/Plan:   1. Essential hypertension At goal. Medications: candesartan 16 mg daily.   Plan:  Avoid buying foods that are: processed, frozen, or prepackaged to avoid excess salt. We will continue to monitor closely alongside her PCP and/or Specialist.  Regular follow up with PCP and specialists was also encouraged.  Continue prudent nutritional plan and weight loss.  BP Readings from Last 3 Encounters:  02/02/21 132/87  01/15/21 137/88  01/01/21 134/83   Lab Results  Component Value Date   CREATININE 0.69 12/18/2020   2. Vitamin D deficiency Not at goal. Current vitamin D is 35.7, tested on 12/18/2020. Optimal goal > 50 ng/dL.  She is taking vitamin D 50,000 IU weekly.  Plan: Continue to take prescription Vitamin D @50 ,000 IU every week as prescribed.  Follow-up for routine testing of Vitamin D, at least 2-3 times per year to avoid  over-replacement.  - Refill Vitamin D, Ergocalciferol, (DRISDOL) 1.25 MG (50000 UNIT) CAPS capsule; Take 1 capsule (50,000 Units total) by mouth every 7 (seven) days.  Dispense: 4 capsule; Refill: 0  3. At risk for dehydration Sylvia Gonzales is at higher than average risk of dehydration.  She is up to 50 ounces of water per day now.  Sylvia Gonzales was given more than 8 minutes of proper hydration counseling today.  We discussed the signs and symptoms of dehydration, some of which may include muscle cramping, constipation or even orthostatic symptoms.  Counseling on the prevention of dehydration was also provided today.  Sylvia Gonzales is at risk for dehydration due to weight loss, lifestyle and behavorial habits and possibly due to taking certain medication(s).  She was encouraged to adequately hydrate and monitor fluid status to avoid dehydration as well as weight loss plateaus.  Unless pre-existing renal or cardiopulmonary conditions exist, in which patient was told to limit their fluid intake, I recommended roughly one half of their weight in pounds to be the approximate ounces of non-caloric, non-caffeinated beverages they should drink per day; including more if they are engaging in exercise.  4. Class 2 severe obesity with serious comorbidity and body mass index (BMI) of 38.0 to 38.9 in adult, unspecified obesity type (HCC)  Course: Sylvia Gonzales is currently in the action stage of change. As such, her goal is to continue with weight loss efforts.   Nutrition goals: She has agreed to the Category 1 Plan.  Exercise goals: As is.  Behavioral modification strategies: increasing lean protein intake, decreasing simple carbohydrates, increasing water intake and celebration eating strategies.  Sylvia Gonzales has agreed to follow-up with our clinic in 2 weeks. She was informed of the importance of frequent follow-up visits to maximize her success with intensive lifestyle modifications for her multiple health conditions.   Objective:    Blood pressure 132/87, pulse 63, temperature 97.9 F (36.6 C), height 5' (1.524 m), weight 197 lb (89.4 kg), SpO2 97 %. Body mass index is 38.47 kg/m.  General: Cooperative, alert, well developed, in no acute distress. HEENT: Conjunctivae and lids unremarkable. Cardiovascular: Regular rhythm.  Lungs: Normal work of breathing. Neurologic: No focal deficits.   Lab Results  Component Value Date   CREATININE 0.69 12/18/2020   BUN 12 12/18/2020   NA 139 12/18/2020   K 4.2 12/18/2020   CL 102 12/18/2020   CO2 22 12/18/2020   Lab Results  Component Value Date   ALT 54 (H) 12/18/2020   AST 104 (H) 12/18/2020   ALKPHOS 96 12/18/2020   BILITOT 0.5 12/18/2020   Lab Results  Component Value Date   HGBA1C 5.5 12/18/2020   Lab Results  Component Value Date   INSULIN 8.6 12/18/2020   Lab Results  Component Value Date   TSH 7.250 (H) 12/18/2020   Lab Results  Component Value Date   CHOL 215 (H) 12/18/2020   HDL 75 12/18/2020   LDLCALC 126 (H) 12/18/2020   TRIG 82 12/18/2020   CHOLHDL 2.9 12/18/2020   Lab Results  Component Value Date   WBC 5.9 12/18/2020   HGB 14.9 12/18/2020   HCT 42.6 12/18/2020   MCV 91 12/18/2020   PLT 280 12/18/2020   Attestation Statements:   Reviewed by clinician on day of visit: allergies, medications, problem list, medical history, surgical history, family history, social history, and previous encounter notes.  I, Insurance claims handler, CMA, am acting as Energy manager for [employer], DO.  I have reviewed the above documentation for accuracy and completeness, and I agree with the above. Sylvia Gonzales, D.O.  The 21st Century Cures Act was signed into law in 2016 which includes the topic of electronic health records.  This provides immediate access to information in MyChart.  This includes consultation notes, operative notes, office notes, lab results and pathology reports.  If you have any questions about what you read please let us know  at your next visit so we can discuss your concerns and take corrective action if need be.  We are right here with you.

## 2021-02-12 ENCOUNTER — Encounter (INDEPENDENT_AMBULATORY_CARE_PROVIDER_SITE_OTHER): Payer: Self-pay | Admitting: Family Medicine

## 2021-02-16 DIAGNOSIS — E039 Hypothyroidism, unspecified: Secondary | ICD-10-CM | POA: Diagnosis not present

## 2021-02-16 DIAGNOSIS — I10 Essential (primary) hypertension: Secondary | ICD-10-CM | POA: Diagnosis not present

## 2021-02-16 DIAGNOSIS — E785 Hyperlipidemia, unspecified: Secondary | ICD-10-CM | POA: Diagnosis not present

## 2021-02-16 LAB — COMPREHENSIVE METABOLIC PANEL
Albumin: 4.4 (ref 3.5–5.0)
Calcium: 9.8 (ref 8.7–10.7)
GFR calc Af Amer: 89
GFR calc non Af Amer: 74

## 2021-02-16 LAB — BASIC METABOLIC PANEL
BUN: 13 (ref 4–21)
CO2: 30 — AB (ref 13–22)
Chloride: 106 (ref 99–108)
Creatinine: 0.8 (ref 0.5–1.1)
Glucose: 88
Potassium: 4.8 (ref 3.4–5.3)
Sodium: 142 (ref 137–147)

## 2021-02-16 LAB — TSH: TSH: 6.87 — AB (ref 0.41–5.90)

## 2021-02-16 LAB — HEPATIC FUNCTION PANEL
ALT: 30 (ref 7–35)
AST: 29 (ref 13–35)
Alkaline Phosphatase: 67 (ref 25–125)
Bilirubin, Total: 0.6

## 2021-02-16 LAB — LIPID PANEL
Cholesterol: 165 (ref 0–200)
HDL: 106 — AB (ref 35–70)
LDL Cholesterol: 94
Triglycerides: 62 (ref 40–160)

## 2021-02-17 ENCOUNTER — Ambulatory Visit (INDEPENDENT_AMBULATORY_CARE_PROVIDER_SITE_OTHER): Payer: BC Managed Care – PPO | Admitting: Family Medicine

## 2021-02-17 ENCOUNTER — Other Ambulatory Visit: Payer: Self-pay

## 2021-02-17 ENCOUNTER — Encounter (INDEPENDENT_AMBULATORY_CARE_PROVIDER_SITE_OTHER): Payer: Self-pay | Admitting: Family Medicine

## 2021-02-17 VITALS — BP 129/79 | HR 65 | Temp 98.0°F | Ht 60.0 in | Wt 195.0 lb

## 2021-02-17 DIAGNOSIS — E559 Vitamin D deficiency, unspecified: Secondary | ICD-10-CM

## 2021-02-17 DIAGNOSIS — Z6838 Body mass index (BMI) 38.0-38.9, adult: Secondary | ICD-10-CM

## 2021-02-17 DIAGNOSIS — Z9189 Other specified personal risk factors, not elsewhere classified: Secondary | ICD-10-CM

## 2021-02-17 MED ORDER — VITAMIN D (ERGOCALCIFEROL) 1.25 MG (50000 UNIT) PO CAPS: 50000.0000 [IU] | ORAL_CAPSULE | ORAL | 0 refills | Status: DC

## 2021-02-18 DIAGNOSIS — Z9189 Other specified personal risk factors, not elsewhere classified: Secondary | ICD-10-CM | POA: Insufficient documentation

## 2021-02-19 NOTE — Progress Notes (Signed)
Chief Complaint:   OBESITY Sylvia Gonzales is here to discuss her progress with her obesity treatment plan along with follow-up of her obesity related diagnoses.   Today's visit was #: 5 Starting weight: 203 lbs Starting date: 12/18/2020 Today's weight: 195 lbs Today's date: 02/17/2021 Total lbs lost to date: 8 lbs Body mass index is 38.08 kg/m.  Total weight loss percentage to date: -3.94%  Interim History:  Sylvia Gonzales is here for a follow up office visit.  she is following the meal plan without concern or issues.  Patient's meal and food recall appears to be accurate and consistent with what is on the plan.  When on plan, her hunger and cravings are well controlled.    Sylvia Gonzales says that she is happy with her results today and her goal is to lose 50 pounds in 1 year.  She will be going to [place] from March 23 through April 14 to renew her visa.   Current Meal Plan: the Category 1 Plan for 85% of the time.  Current Exercise Plan: Walking/strength training/yoga for 50 minutes 4-5 times per week.  Assessment/Plan:    Medications Discontinued During This Encounter  Medication Reason  . Vitamin D, Ergocalciferol, (DRISDOL) 1.25 MG (50000 UNIT) CAPS capsule Reorder     Meds ordered this encounter  Medications  . Vitamin D, Ergocalciferol, (DRISDOL) 1.25 MG (50000 UNIT) CAPS capsule    Sig: Take 1 capsule (50,000 Units total) by mouth every 7 (seven) days.    Dispense:  8 capsule    Refill:  0      1. Vitamin D deficiency Not at goal. Current vitamin D is 35.7, tested on 12/18/2020. Optimal goal > 50 ng/dL.  She is taking vitamin D 50,000 IU weekly.  Plan: - Reiterated importance of vitamin D (as well as calcium) to their health and wellbeing.  - Reminded Sylvia Gonzales that weight loss will likely improve availability of vitamin D, thus encouraged her to continue with meal plan and their weight loss efforts to further improve this condition. - I recommend patient continue to take  weekly prescription vit D 50,000 IU - Informed patient this may be a lifelong thing, and she was encouraged to continue to take the medicine until told otherwise.   - we will need to monitor levels regularly (every 3-4 mo on average) to keep levels within normal limits.  - weight loss will likely improve availability of vitamin D, thus encouraged Sylvia Gonzales to continue with meal plan and their weight loss efforts to further improve this condition - pt's questions and concerns regarding this condition addressed.  - Refill Vitamin D, Ergocalciferol, (DRISDOL) 1.25 MG (50000 UNIT) CAPS capsule; Take 1 capsule (50,000 Units total) by mouth every 7 (seven) days.  Dispense: 8 capsule; Refill: 0   2. At risk for malnutrition Sylvia Gonzales was given extensive malnutrition prevention education and counseling today of more than 8 minutes.  Counseled her that malnutrition refers to inappropriate nutrients or not the right balance of nutrients for optimal health.  Discussed with Sylvia Gonzales that it is absolutely possible to be malnourished but yet obese.  Risk factors, including but not limited to, inappropriate dietary choices, difficulty with obtaining food due to physical or financial limitations, and various physical and mental health conditions were reviewed with Sylvia Gonzales.    3. Class 2 severe obesity with serious comorbidity and body mass index (BMI) of 38.0 to 38.9 in adult, unspecified obesity type (HCC)  Course: Sylvia Gonzales is currently  in the action stage of change. As such, her goal is to continue with weight loss efforts.   Nutrition goals: She has agreed to the Category 1 Plan.   Exercise goals: As is.  Behavioral modification strategies: increasing water intake, keeping healthy foods in the home and avoiding temptations.  Sylvia Gonzales has agreed to follow-up with our clinic in 2 weeks and in 6 weeks. She was informed of the importance of frequent follow-up visits to maximize her success with intensive  lifestyle modifications for her multiple health conditions.   Objective:   Blood pressure 129/79, pulse 65, temperature 98 F (36.7 C), height 5' (1.524 m), weight 195 lb (88.5 kg), SpO2 97 %. Body mass index is 38.08 kg/m.  General: Cooperative, alert, well developed, in no acute distress. HEENT: Conjunctivae and lids unremarkable. Cardiovascular: Regular rhythm.  Lungs: Normal work of breathing. Neurologic: No focal deficits.   Lab Results  Component Value Date   CREATININE 0.69 12/18/2020   BUN 12 12/18/2020   NA 139 12/18/2020   K 4.2 12/18/2020   CL 102 12/18/2020   CO2 22 12/18/2020   Lab Results  Component Value Date   ALT 54 (H) 12/18/2020   AST 104 (H) 12/18/2020   ALKPHOS 96 12/18/2020   BILITOT 0.5 12/18/2020   Lab Results  Component Value Date   HGBA1C 5.5 12/18/2020   Lab Results  Component Value Date   INSULIN 8.6 12/18/2020   Lab Results  Component Value Date   TSH 7.250 (H) 12/18/2020   Lab Results  Component Value Date   CHOL 215 (H) 12/18/2020   HDL 75 12/18/2020   LDLCALC 126 (H) 12/18/2020   TRIG 82 12/18/2020   CHOLHDL 2.9 12/18/2020   Lab Results  Component Value Date   WBC 5.9 12/18/2020   HGB 14.9 12/18/2020   HCT 42.6 12/18/2020   MCV 91 12/18/2020   PLT 280 12/18/2020   Attestation Statements:   Reviewed by clinician on day of visit: allergies, medications, problem list, medical history, surgical history, family history, social history, and previous encounter notes.  I, Insurance claims handler, CMA, am acting as Energy manager for [employer], DO.  I have reviewed the above documentation for accuracy and completeness, and I agree with the above. Carlye Grippe, D.O.  The 21st Century Cures Act was signed into law in 2016 which includes the topic of electronic health records.  This provides immediate access to information in MyChart.  This includes consultation notes, operative notes, office notes, lab results and pathology  reports.  If you have any questions about what you read please let us know at your next visit so we can discuss your concerns and take corrective action if need be.  We are right here with you.

## 2021-02-20 DIAGNOSIS — I10 Essential (primary) hypertension: Secondary | ICD-10-CM | POA: Diagnosis not present

## 2021-02-20 DIAGNOSIS — Z23 Encounter for immunization: Secondary | ICD-10-CM | POA: Diagnosis not present

## 2021-02-20 DIAGNOSIS — Z0000 Encounter for general adult medical examination without abnormal findings: Secondary | ICD-10-CM | POA: Diagnosis not present

## 2021-02-20 DIAGNOSIS — Z124 Encounter for screening for malignant neoplasm of cervix: Secondary | ICD-10-CM | POA: Diagnosis not present

## 2021-02-20 DIAGNOSIS — E039 Hypothyroidism, unspecified: Secondary | ICD-10-CM | POA: Diagnosis not present

## 2021-02-20 DIAGNOSIS — E785 Hyperlipidemia, unspecified: Secondary | ICD-10-CM | POA: Diagnosis not present

## 2021-02-22 ENCOUNTER — Other Ambulatory Visit (INDEPENDENT_AMBULATORY_CARE_PROVIDER_SITE_OTHER): Payer: Self-pay | Admitting: Family Medicine

## 2021-02-22 DIAGNOSIS — E559 Vitamin D deficiency, unspecified: Secondary | ICD-10-CM

## 2021-02-23 ENCOUNTER — Other Ambulatory Visit (INDEPENDENT_AMBULATORY_CARE_PROVIDER_SITE_OTHER): Payer: Self-pay | Admitting: Family Medicine

## 2021-02-23 ENCOUNTER — Ambulatory Visit (INDEPENDENT_AMBULATORY_CARE_PROVIDER_SITE_OTHER): Payer: BC Managed Care – PPO | Admitting: Family Medicine

## 2021-02-23 DIAGNOSIS — E559 Vitamin D deficiency, unspecified: Secondary | ICD-10-CM

## 2021-02-27 DIAGNOSIS — Z1212 Encounter for screening for malignant neoplasm of rectum: Secondary | ICD-10-CM | POA: Diagnosis not present

## 2021-02-27 DIAGNOSIS — Z1211 Encounter for screening for malignant neoplasm of colon: Secondary | ICD-10-CM | POA: Diagnosis not present

## 2021-03-03 ENCOUNTER — Encounter (INDEPENDENT_AMBULATORY_CARE_PROVIDER_SITE_OTHER): Payer: Self-pay

## 2021-03-03 ENCOUNTER — Encounter (INDEPENDENT_AMBULATORY_CARE_PROVIDER_SITE_OTHER): Payer: Self-pay | Admitting: Family Medicine

## 2021-03-03 ENCOUNTER — Other Ambulatory Visit: Payer: Self-pay

## 2021-03-03 ENCOUNTER — Ambulatory Visit (INDEPENDENT_AMBULATORY_CARE_PROVIDER_SITE_OTHER): Payer: BC Managed Care – PPO | Admitting: Family Medicine

## 2021-03-03 ENCOUNTER — Other Ambulatory Visit (INDEPENDENT_AMBULATORY_CARE_PROVIDER_SITE_OTHER): Payer: Self-pay

## 2021-03-03 VITALS — BP 124/81 | HR 70 | Temp 97.5°F | Ht 60.0 in | Wt 192.0 lb

## 2021-03-03 DIAGNOSIS — Z6839 Body mass index (BMI) 39.0-39.9, adult: Secondary | ICD-10-CM

## 2021-03-03 DIAGNOSIS — E7849 Other hyperlipidemia: Secondary | ICD-10-CM

## 2021-03-03 DIAGNOSIS — Z9189 Other specified personal risk factors, not elsewhere classified: Secondary | ICD-10-CM | POA: Diagnosis not present

## 2021-03-03 DIAGNOSIS — R748 Abnormal levels of other serum enzymes: Secondary | ICD-10-CM | POA: Diagnosis not present

## 2021-03-03 DIAGNOSIS — E559 Vitamin D deficiency, unspecified: Secondary | ICD-10-CM

## 2021-03-03 DIAGNOSIS — E038 Other specified hypothyroidism: Secondary | ICD-10-CM | POA: Diagnosis not present

## 2021-03-05 LAB — EXTERNAL GENERIC LAB PROCEDURE: COLOGUARD: NEGATIVE

## 2021-03-10 NOTE — Progress Notes (Signed)
Chief Complaint:   OBESITY Sylvia Gonzales is here to discuss her progress with her obesity treatment plan along with follow-up of her obesity related diagnoses.   Today's visit was #: 6 Starting weight: 203 lbs Starting date: 12/18/2020 Today's weight: 192 lbs Today's date: 03/03/2021 Total lbs lost to date: 11 lbs Body mass index is 37.5 kg/m.  Total weight loss percentage to date: -5.42%  Interim History:  Sylvia Gonzales is here for a follow up office visit and she is following the meal plan without concerns or issues.  Patient's meal and food recall appears to be accurate and consistent with what is on the plan.  When on plan, her hunger and cravings are well controlled.    Sylvia Gonzales had labs done at her PCP's office on 02/16/2021 (FLP, CMP, TSH) and wanted me to review them with her today.  She is going to [place] in the near future.  We discussed travel eating strategies while she is away.  Current Meal Plan: the Category 1 Plan for 85% of the time.  Current Exercise Plan: Walking, yoga, toning for 45-50 minutes 3 times per week.  Assessment/Plan:   Orders Placed This Encounter  Procedures  . VITAMIN D 25 Hydroxy (Vit-D Deficiency, Fractures)  . TSH  . T4, free    1. Other specified hypothyroidism Medication: levothyroxine 112 mcg daily.  PCP increased her Synthroid from 112 to 125 mcg on 02/23/2021 due ot TSH of 6.87.  Tolerating new dose.  Plan:  Discussed labs with patient today.  Patient was instructed not to take MVM or iron within 4 hours of taking thyroid medications. This issue is managed by her PCP. We will continue to monitor alongside Endocrinology/PCP as it relates to her weight loss journey.  Will recheck TSH and free T4 in 6-8 weeks on 04/06/2021.  Continue medication per PCP.  Lab Results  Component Value Date   TSH 6.87 (A) 02/16/2021   - TSH - T4, free  2. Elevated liver enzymes ALT was 54 and is now 30, AST was 104 and is now 29 recently at PCP's office.     Plan:  Discussed labs with patient today.  Continue to abstain/cut back on ETOH use.  Use Tylenol as needed, if at all.  Elevated liver transaminases with an ALT predominance combined with obesity and insulin resistance is characteristic, but not diagnostic of non-alcoholic fatty liver disease (NAFLD). NAFLD is the 2nd leading cause of liver transplant in adults. Treatment includes weight loss, elimination of sweet drinks, including juice, avoidance of high fructose corn syrup, and exercise. As always, avoiding alcohol consumption is important.  Lab Results  Component Value Date   ALT 30 02/16/2021   AST 29 02/16/2021   ALKPHOS 67 02/16/2021   BILITOT 0.5 12/18/2020   3. Vitamin D deficiency Not at goal. Current vitamin D is 35.7, tested on 12/18/2020. Optimal goal > 50 ng/dL.  She is taking vitamin D 50,000 IU weekly.  Plan: Continue to take prescription Vitamin D @50 ,000 IU every week as prescribed.  Will recheck vitamin D at next office visit.  - VITAMIN D 25 Hydroxy (Vit-D Deficiency, Fractures)  4. Other hyperlipidemia Course: Controlled. Lipid-lowering medications: Lipitor 80 mg daily.  Improved LDL at last check at 94 (improved from 126 in early January 2022).   Plan:  Discussed labs with patient today.  Continue Lipitor 80 mg daily per PCP.  Increase exercise and continue prudent nutritional plan. Dietary changes: Increase soluble fiber, decrease simple carbohydrates,  decrease saturated fat. Exercise changes: Moderate to vigorous-intensity aerobic activity 150 minutes per week or as tolerated. We will continue to monitor along with PCP/specialists as it pertains to her weight loss journey.  Lab Results  Component Value Date   CHOL 165 02/16/2021   HDL 106 (A) 02/16/2021   LDLCALC 94 02/16/2021   TRIG 62 02/16/2021   CHOLHDL 2.9 12/18/2020   Lab Results  Component Value Date   ALT 30 02/16/2021   AST 29 02/16/2021   ALKPHOS 67 02/16/2021   BILITOT 0.5 12/18/2020   5. At  risk for dehydration Sylvia Gonzales is at higher than average risk of dehydration.  Sylvia Gonzales was given more than 9 minutes of proper hydration counseling today.  We discussed the signs and symptoms of dehydration, some of which may include muscle cramping, constipation or even orthostatic symptoms.  Counseling on the prevention of dehydration was also provided today.  Sylvia Gonzales is at risk for dehydration due to weight loss, lifestyle and behavorial habits and possibly due to taking certain medication(s).  She was encouraged to adequately hydrate and monitor fluid status to avoid dehydration as well as weight loss plateaus.  Unless pre-existing renal or cardiopulmonary conditions exist, in which patient was told to limit their fluid intake, I recommended roughly one half of their weight in pounds to be the approximate ounces of non-caloric, non-caffeinated beverages they should drink per day; including more if they are engaging in exercise.  6. Current BMI 37.6  Course: Sylvia Gonzales is currently in the action stage of change. As such, her goal is to continue with weight loss efforts.   Nutrition goals: She has agreed to the Category 1 Plan.   Exercise goals: As is.  Behavioral modification strategies: increasing lean protein intake, travel eating strategies, holiday eating strategies  and avoiding temptations.  Sylvia Gonzales has agreed to follow-up with our clinic in 4 weeks.  Will obtain blood work 2-3 days prior to next office visit.  She was informed of the importance of frequent follow-up visits to maximize her success with intensive lifestyle modifications for her multiple health conditions.   Objective:   Blood pressure 124/81, pulse 70, temperature (!) 97.5 F (36.4 C), height 5' (1.524 m), weight 192 lb (87.1 kg), SpO2 98 %. Body mass index is 37.5 kg/m.  General: Cooperative, alert, well developed, in no acute distress. HEENT: Conjunctivae and lids unremarkable. Cardiovascular: Regular rhythm.  Lungs: Normal work  of breathing. Neurologic: No focal deficits.   Lab Results  Component Value Date   CREATININE 0.8 02/16/2021   BUN 13 02/16/2021   NA 142 02/16/2021   K 4.8 02/16/2021   CL 106 02/16/2021   CO2 30 (A) 02/16/2021   Lab Results  Component Value Date   ALT 30 02/16/2021   AST 29 02/16/2021   ALKPHOS 67 02/16/2021   BILITOT 0.5 12/18/2020   Lab Results  Component Value Date   HGBA1C 5.5 12/18/2020   Lab Results  Component Value Date   INSULIN 8.6 12/18/2020   Lab Results  Component Value Date   TSH 6.87 (A) 02/16/2021   Lab Results  Component Value Date   CHOL 165 02/16/2021   HDL 106 (A) 02/16/2021   LDLCALC 94 02/16/2021   TRIG 62 02/16/2021   CHOLHDL 2.9 12/18/2020   Lab Results  Component Value Date   WBC 5.9 12/18/2020   HGB 14.9 12/18/2020   HCT 42.6 12/18/2020   MCV 91 12/18/2020   PLT 280 12/18/2020   Attestation Statements:  Reviewed by clinician on day of visit: allergies, medications, problem list, medical history, surgical history, family history, social history, and previous encounter notes.  I, Insurance claims handler, CMA, am acting as Energy manager for [employer], DO.  I have reviewed the above documentation for accuracy and completeness, and I agree with the above. Carlye Grippe, D.O.  The 21st Century Cures Act was signed into law in 2016 which includes the topic of electronic health records.  This provides immediate access to information in MyChart.  This includes consultation notes, operative notes, office notes, lab results and pathology reports.  If you have any questions about what you read please let us know at your next visit so we can discuss your concerns and take corrective action if need be.  We are right here with you.

## 2021-04-01 DIAGNOSIS — E038 Other specified hypothyroidism: Secondary | ICD-10-CM | POA: Diagnosis not present

## 2021-04-01 DIAGNOSIS — E559 Vitamin D deficiency, unspecified: Secondary | ICD-10-CM | POA: Diagnosis not present

## 2021-04-02 LAB — T4, FREE: Free T4: 1.5 ng/dL (ref 0.82–1.77)

## 2021-04-02 LAB — VITAMIN D 25 HYDROXY (VIT D DEFICIENCY, FRACTURES): Vit D, 25-Hydroxy: 61 ng/mL (ref 30.0–100.0)

## 2021-04-02 LAB — TSH: TSH: 4.79 u[IU]/mL — ABNORMAL HIGH (ref 0.450–4.500)

## 2021-04-06 ENCOUNTER — Ambulatory Visit (INDEPENDENT_AMBULATORY_CARE_PROVIDER_SITE_OTHER): Payer: BC Managed Care – PPO | Admitting: Family Medicine

## 2021-04-06 ENCOUNTER — Encounter (INDEPENDENT_AMBULATORY_CARE_PROVIDER_SITE_OTHER): Payer: Self-pay | Admitting: Family Medicine

## 2021-04-06 ENCOUNTER — Other Ambulatory Visit: Payer: Self-pay

## 2021-04-06 VITALS — BP 132/89 | HR 64 | Temp 98.0°F | Ht 60.0 in | Wt 194.0 lb

## 2021-04-06 DIAGNOSIS — E038 Other specified hypothyroidism: Secondary | ICD-10-CM

## 2021-04-06 DIAGNOSIS — E559 Vitamin D deficiency, unspecified: Secondary | ICD-10-CM | POA: Diagnosis not present

## 2021-04-06 DIAGNOSIS — Z6839 Body mass index (BMI) 39.0-39.9, adult: Secondary | ICD-10-CM

## 2021-04-06 DIAGNOSIS — Z9189 Other specified personal risk factors, not elsewhere classified: Secondary | ICD-10-CM

## 2021-04-06 MED ORDER — VITAMIN D (ERGOCALCIFEROL) 1.25 MG (50000 UNIT) PO CAPS: 50000.0000 [IU] | ORAL_CAPSULE | ORAL | 0 refills | Status: DC

## 2021-04-08 NOTE — Progress Notes (Addendum)
Chief Complaint:   OBESITY Sylvia Gonzales is here to discuss her progress with her obesity treatment plan along with follow-up of her obesity related diagnoses.   Today's visit was #: 7 Starting weight: 203 lbs Starting date: 12/18/2020 Today's weight: 194 lbs Today's date: 04/06/2021 Total lbs lost to date: 9 lbs Body mass index is 37.89 kg/m.  Total weight loss percentage to date: -4.43%  Interim History:  Sylvia Gonzales just came back from [place], after being there for 1 month.  She ate off plan the majority of the time, but did portion control.  She says she has no issues with the plan.  She is here to review labs today.    Her own goal is to lose 4 pounds by her next office visit.  Current Meal Plan: the Category 1 Plan for 10% of the time.  Current Exercise Plan: Walking for 60 minutes 4 times per week.  Assessment/Plan:   1. Vitamin D deficiency At goal. Current vitamin D is 61.0, tested on 04/01/2021. Optimal goal > 50 ng/dL.  is taking vitamin D 50,000 IU weekly.    Plan:  Discussed labs with patient today.  Continue to take prescription Vitamin D @50 ,000 IU every week as prescribed.  Follow-up for routine testing of Vitamin D, at least 2-3 times per year to avoid over-replacement.  - Refill Vitamin D, Ergocalciferol, (DRISDOL) 1.25 MG (50000 UNIT) CAPS capsule; Take 1 capsule (50,000 Units total) by mouth every 7 (seven) days for 28 days.  Dispense: 4 capsule; Refill: 0  2. Other specified hypothyroidism Medication: Synthroid 125 mcg daily.  Changed to higher dose of Synthroid around 6 weeks ago.  Tolerating weill, without issues.  Plan:  Discussed labs with patient today.  Labs essentially at goal.  Continue current medication.  Patient was instructed not to take MVM or iron within 4 hours of taking thyroid medications. We will continue to monitor alongside Endocrinology/PCP as it relates to her weight loss journey.   Lab Results  Component Value Date   TSH 4.790 (H) 04/01/2021    3. At risk for osteoporosis [name] was given approximately 9 minutes of osteoporosis prevention counseling today.   Sylvia Gonzales is at risk for osteopenia and osteoporosis due to Vitamin D deficiency, as well as other risk factors.  We discussed the importance of prudent screenings through her PCP's office for prevention.     Sylvia Gonzales was encouraged to take her Vitamin D and follow her calcium rich diet.  We will continue to monitor vitamin D levels to ensure treatment is appropriate.   It is recommended that she eventually engage in weight bearing exercises and muscle strengthening exercises to help improve bone density and decrease her risk of osteopenia and osteoporosis.  4. Obesity, current BMI 38.0  Course: Sylvia Gonzales is currently in the action stage of change. As such, her goal is to continue with weight loss efforts.   Nutrition goals: She has agreed to the Category 1 Plan.   Exercise goals: As is.  Behavioral modification strategies: increasing water intake, meal planning and cooking strategies and planning for success.  Sylvia Gonzales has agreed to follow-up with our clinic in 2 weeks. She was informed of the importance of frequent follow-up visits to maximize her success with intensive lifestyle modifications for her multiple health conditions.   Objective:   Blood pressure 132/89, pulse 64, temperature 98 F (36.7 C), height 5' (1.524 m), weight 194 lb (88 kg), SpO2 96 %. Body mass index is 37.89 kg/m.  General: Cooperative, alert, well developed, in no acute distress. HEENT: Conjunctivae and lids unremarkable. Cardiovascular: Regular rhythm.  Lungs: Normal work of breathing. Neurologic: No focal deficits.   Lab Results  Component Value Date   CREATININE 0.8 02/16/2021   BUN 13 02/16/2021   NA 142 02/16/2021   K 4.8 02/16/2021   CL 106 02/16/2021   CO2 30 (A) 02/16/2021   Lab Results  Component Value Date   ALT 30 02/16/2021   AST 29 02/16/2021   ALKPHOS 67 02/16/2021   BILITOT 0.5  12/18/2020   Lab Results  Component Value Date   HGBA1C 5.5 12/18/2020   Lab Results  Component Value Date   INSULIN 8.6 12/18/2020   Lab Results  Component Value Date   TSH 4.790 (H) 04/01/2021   Lab Results  Component Value Date   CHOL 165 02/16/2021   HDL 106 (A) 02/16/2021   LDLCALC 94 02/16/2021   TRIG 62 02/16/2021   CHOLHDL 2.9 12/18/2020   Lab Results  Component Value Date   WBC 5.9 12/18/2020   HGB 14.9 12/18/2020   HCT 42.6 12/18/2020   MCV 91 12/18/2020   PLT 280 12/18/2020   Attestation Statements:   Reviewed by clinician on day of visit: allergies, medications, problem list, medical history, surgical history, family history, social history, and previous encounter notes.  I, Insurance claims handler, CMA, am acting as Energy manager for [employer], DO.  I have reviewed the above documentation for accuracy and completeness, and I agree with the above. Carlye Grippe, D.O.  The 21st Century Cures Act was signed into law in 2016 which includes the topic of electronic health records.  This provides immediate access to information in MyChart.  This includes consultation notes, operative notes, office notes, lab results and pathology reports.  If you have any questions about what you read please let us know at your next visit so we can discuss your concerns and take corrective action if need be.  We are right here with you.

## 2021-04-13 ENCOUNTER — Other Ambulatory Visit: Payer: Self-pay

## 2021-04-13 ENCOUNTER — Ambulatory Visit
Admission: RE | Admit: 2021-04-13 | Discharge: 2021-04-13 | Disposition: A | Discharge status: Home / Self Care | Payer: PRIVATE HEALTH INSURANCE | Source: Ambulatory Visit | Attending: Family Medicine | Admitting: Family Medicine

## 2021-04-13 DIAGNOSIS — Z1231 Encounter for screening mammogram for malignant neoplasm of breast: Secondary | ICD-10-CM

## 2021-04-20 ENCOUNTER — Encounter (INDEPENDENT_AMBULATORY_CARE_PROVIDER_SITE_OTHER): Payer: Self-pay | Admitting: Family Medicine

## 2021-04-20 ENCOUNTER — Ambulatory Visit (INDEPENDENT_AMBULATORY_CARE_PROVIDER_SITE_OTHER): Payer: BC Managed Care – PPO | Admitting: Family Medicine

## 2021-04-20 ENCOUNTER — Other Ambulatory Visit: Payer: Self-pay

## 2021-04-20 VITALS — BP 117/79 | HR 64 | Temp 97.5°F | Ht 60.0 in | Wt 192.0 lb

## 2021-04-20 DIAGNOSIS — Z9189 Other specified personal risk factors, not elsewhere classified: Secondary | ICD-10-CM | POA: Diagnosis not present

## 2021-04-20 DIAGNOSIS — E038 Other specified hypothyroidism: Secondary | ICD-10-CM | POA: Diagnosis not present

## 2021-04-20 DIAGNOSIS — E559 Vitamin D deficiency, unspecified: Secondary | ICD-10-CM | POA: Diagnosis not present

## 2021-04-20 DIAGNOSIS — Z6839 Body mass index (BMI) 39.0-39.9, adult: Secondary | ICD-10-CM

## 2021-04-22 NOTE — Telephone Encounter (Signed)
Pt last seen by Dr. Opalski.  

## 2021-04-22 NOTE — Telephone Encounter (Signed)
FYI

## 2021-04-22 NOTE — Telephone Encounter (Signed)
Please review

## 2021-04-23 ENCOUNTER — Other Ambulatory Visit (INDEPENDENT_AMBULATORY_CARE_PROVIDER_SITE_OTHER): Payer: Self-pay

## 2021-04-23 NOTE — Telephone Encounter (Signed)
Added to the chart.

## 2021-04-28 NOTE — Progress Notes (Signed)
Chief Complaint:   OBESITY Sylvia Gonzales is here to discuss her progress with her obesity treatment plan along with follow-up of her obesity related diagnoses.   Today's visit was #: 8 Starting weight: 203 lbs Starting date: 12/18/2020 Today's weight: 192 lbs Today's date: 5/9/202 Weight change since last visit: 2 lbs Total lbs lost to date: 11 lbs Body mass index is 37.5 kg/m.  Total weight loss percentage to date: -5.42%  Interim History:  Sylvia Gonzales has lost 2 pounds since her last office visit.  She has increased her activity from her last office visit as well.  She is still eating out 1-2 times per week and eating off plan on weekends while out riding on her motorbike with Tom.  Current Meal Plan: the Category 1 Plan for 85% of the time.  Current Exercise Plan: Walking for 45-50 minutes 5 times per day.  Assessment/Plan:   No orders of the defined types were placed in this encounter.   There are no discontinued medications.   No orders of the defined types were placed in this encounter.   1. Vitamin D deficiency At goal. Current vitamin D is 61.0, tested on 04/01/2021. Optimal goal > 50 ng/dL.  Per patient, had a bone density test 2-2.5 years ago and she will ask her PCP about those results and when to repeat.  Plan:  Will refill vitamin D and recheck at the end of August or so.  Continue to take prescription Vitamin D @50 ,000 IU every week as prescribed.  Follow-up for routine testing of Vitamin D, at least 2-3 times per year to avoid over-replacement.  2. Other specified hypothyroidism Medication: Synthroid 125 mcg daily.  Dose changed around 02/23/2021 to a higher dose from 112-125 mcg.  Has follow-up with PCP on June 13th and she wishes to wait for recheck of labs until then.  Plan: Patient was instructed not to take MVM or iron within 4 hours of taking thyroid medications. This issue is managed by her PCP. We will continue to monitor alongside Endocrinology/PCP as it relates to  her weight loss journey.  Medication management per PCP.  She is without side effects and is tolerating the medication well.  She will bring in a copy of all of her labs from her PCP.   Lab Results  Component Value Date   TSH 4.790 (H) 04/01/2021   3. At risk for impaired metabolic function Due to [facility] current state of health and medical condition(s), she is at a significantly higher risk for impaired metabolic function.   At least 8 minutes was spent on counseling Sylvia Gonzales about these concerns today.  This places the patient at a much greater risk to subsequently develop cardio-pulmonary conditions that can negatively affect the patient's quality of life.  I stressed the importance of reversing these risks factors.  The initial goal is to lose at least 5-10% of starting weight to help reduce risk factors.  Counseling:  Intensive lifestyle modifications discussed with Sylvia Gonzales as the most appropriate first line treatment.  she will continue to work on diet, exercise, and weight loss efforts.  We will continue to reassess these conditions on a fairly regular basis in an attempt to decrease the patient's overall morbidity and mortality.  4. Obesity, current BMI 37.5  Course: Sylvia Gonzales is currently in the action stage of change. As such, her goal is to continue with weight loss efforts.   Nutrition goals: She has agreed to the Category 1 Plan.   Exercise  goals: As is.  Behavioral modification strategies: decreasing eating out and travel eating strategies, planning and taking along lunch/dinner while traveling.  Sylvia Gonzales has agreed to follow-up with our clinic in 2-3 weeks. She was informed of the importance of frequent follow-up visits to maximize her success with intensive lifestyle modifications for her multiple health conditions.   Objective:   Blood pressure 117/79, pulse 64, temperature (!) 97.5 F (36.4 C), height 5' (1.524 m), weight 192 lb (87.1 kg), SpO2 98 %. Body mass index is 37.5  kg/m.  General: Cooperative, alert, well developed, in no acute distress. HEENT: Conjunctivae and lids unremarkable. Cardiovascular: Regular rhythm.  Lungs: Normal work of breathing. Neurologic: No focal deficits.   Lab Results  Component Value Date   CREATININE 0.8 02/16/2021   BUN 13 02/16/2021   NA 142 02/16/2021   K 4.8 02/16/2021   CL 106 02/16/2021   CO2 30 (A) 02/16/2021   Lab Results  Component Value Date   ALT 30 02/16/2021   AST 29 02/16/2021   ALKPHOS 67 02/16/2021   BILITOT 0.5 12/18/2020   Lab Results  Component Value Date   HGBA1C 5.5 12/18/2020   Lab Results  Component Value Date   INSULIN 8.6 12/18/2020   Lab Results  Component Value Date   TSH 4.790 (H) 04/01/2021   Lab Results  Component Value Date   CHOL 165 02/16/2021   HDL 106 (A) 02/16/2021   LDLCALC 94 02/16/2021   TRIG 62 02/16/2021   CHOLHDL 2.9 12/18/2020   Lab Results  Component Value Date   WBC 5.9 12/18/2020   HGB 14.9 12/18/2020   HCT 42.6 12/18/2020   MCV 91 12/18/2020   PLT 280 12/18/2020   Attestation Statements:   Reviewed by clinician on day of visit: allergies, medications, problem list, medical history, surgical history, family history, social history, and previous encounter notes.  I, Insurance claims handler, CMA, am acting as Energy manager for [employer], DO.  I have reviewed the above documentation for accuracy and completeness, and I agree with the above. Carlye Grippe, D.O.  The 21st Century Cures Act was signed into law in 2016 which includes the topic of electronic health records.  This provides immediate access to information in MyChart.  This includes consultation notes, operative notes, office notes, lab results and pathology reports.  If you have any questions about what you read please let us know at your next visit so we can discuss your concerns and take corrective action if need be.  We are right here with you.

## 2021-05-13 ENCOUNTER — Ambulatory Visit (INDEPENDENT_AMBULATORY_CARE_PROVIDER_SITE_OTHER): Payer: BC Managed Care – PPO | Admitting: Family Medicine

## 2021-05-13 ENCOUNTER — Other Ambulatory Visit: Payer: Self-pay

## 2021-05-13 ENCOUNTER — Encounter (INDEPENDENT_AMBULATORY_CARE_PROVIDER_SITE_OTHER): Payer: Self-pay | Admitting: Family Medicine

## 2021-05-13 VITALS — BP 138/88 | HR 64 | Temp 98.0°F | Ht 60.0 in | Wt 191.0 lb

## 2021-05-13 DIAGNOSIS — E559 Vitamin D deficiency, unspecified: Secondary | ICD-10-CM | POA: Diagnosis not present

## 2021-05-13 DIAGNOSIS — Z9189 Other specified personal risk factors, not elsewhere classified: Secondary | ICD-10-CM

## 2021-05-13 DIAGNOSIS — Z6839 Body mass index (BMI) 39.0-39.9, adult: Secondary | ICD-10-CM | POA: Diagnosis not present

## 2021-05-13 MED ORDER — VITAMIN D (ERGOCALCIFEROL) 1.25 MG (50000 UNIT) PO CAPS: 50000.0000 [IU] | ORAL_CAPSULE | ORAL | 0 refills | Status: DC

## 2021-05-19 ENCOUNTER — Other Ambulatory Visit (INDEPENDENT_AMBULATORY_CARE_PROVIDER_SITE_OTHER): Payer: Self-pay | Admitting: Family Medicine

## 2021-05-19 DIAGNOSIS — E559 Vitamin D deficiency, unspecified: Secondary | ICD-10-CM

## 2021-05-25 DIAGNOSIS — Z0000 Encounter for general adult medical examination without abnormal findings: Secondary | ICD-10-CM | POA: Diagnosis not present

## 2021-05-25 DIAGNOSIS — E039 Hypothyroidism, unspecified: Secondary | ICD-10-CM | POA: Diagnosis not present

## 2021-05-25 LAB — TSH: TSH: 0.84 (ref 0.41–5.90)

## 2021-05-25 NOTE — Progress Notes (Addendum)
Chief Complaint:   OBESITY Sylvia Gonzales is here to discuss her progress with her obesity treatment plan along with follow-up of her obesity related diagnoses.   Today's visit was #: 9 Starting weight: 203 lbs Starting date: 12/18/2020 Today's weight: 191 lbs Today's date: 05/13/2021 Weight change since last visit: 1 lb Total lbs lost to date: 12 lbs Body mass index is 37.3 kg/m.  Total weight loss percentage to date: -5.91%  Interim History:  [name] says that last month was her birthday month.  She is glad she lost 1 pound.  She was not on plan much.  Current Meal Plan: the Category 1 Plan for 50% of the time.  Current Exercise Plan: Walking/strength training/Yoga for 50 minutes 4 times per week.  Assessment/Plan:   Medications Discontinued During This Encounter  Medication Reason   Vitamin D, Ergocalciferol, (DRISDOL) 1.25 MG (50000 UNIT) CAPS capsule Reorder    Meds ordered this encounter  Medications   Vitamin D, Ergocalciferol, (DRISDOL) 1.25 MG (50000 UNIT) CAPS capsule    Sig: Take 1 capsule (50,000 Units total) by mouth every 7 (seven) days for 28 days.    Dispense:  4 capsule    Refill:  0    Refills only with f/up OV's    1. Vitamin D deficiency At goal. Current vitamin D is 61.0, tested on 04/01/2021. Optimal goal > 50 ng/dL.  She is taking vitamin D 50,000 IU weekly.  Plan: Continue to take prescription Vitamin D @50 ,000 IU every week as prescribed.  Follow-up for routine testing of Vitamin D, at least 2-3 times per year to avoid over-replacement.  - Refill Vitamin D, Ergocalciferol, (DRISDOL) 1.25 MG (50000 UNIT) CAPS capsule; Take 1 capsule (50,000 Units total) by mouth every 7 (seven) days for 28 days.  Dispense: 4 capsule; Refill: 0  2. At risk for osteoporosis [name] was given approximately 9 minutes of osteoporosis prevention counseling today.   [name] is at risk for osteopenia and osteoporosis due to Vitamin D deficiency, as well as other risk factors.  We  discussed the importance of prudent screenings through her PCP's office for prevention.     [name] was encouraged to take her Vitamin D and follow her calcium rich diet.  We will continue to monitor vitamin D levels to ensure treatment is appropriate.   It is recommended that she eventually engage in weight bearing exercises and muscle strengthening exercises to help improve bone density and decrease her risk of osteopenia and osteoporosis.   3. Obesity, current BMI 37.4  Course: [name] is currently in the action stage of change. As such, her goal is to continue with weight loss efforts.   Nutrition goals: She has agreed to the Category 1 Plan or keeping a food journal and adhering to recommended goals of 1000-1100 calories and 80+ grams of protein.   Exercise goals:  As is.  Behavioral modification strategies: planning for success.  [name] has agreed to follow-up with our clinic in 3 weeks. She was informed of the importance of frequent follow-up visits to maximize her success with intensive lifestyle modifications for her multiple health conditions.   Objective:   Blood pressure 138/88, pulse 64, temperature 98 F (36.7 C), height 5' (1.524 m), weight 191 lb (86.6 kg), SpO2 97 %. Body mass index is 37.3 kg/m.  General: Cooperative, alert, well developed, in no acute distress. HEENT: Conjunctivae and lids unremarkable. Cardiovascular: Regular rhythm.  Lungs: Normal work of breathing. Neurologic: No focal deficits.   Lab  Results  Component Value Date   CREATININE 0.8 02/16/2021   BUN 13 02/16/2021   NA 142 02/16/2021   K 4.8 02/16/2021   CL 106 02/16/2021   CO2 30 (A) 02/16/2021   Lab Results  Component Value Date   ALT 30 02/16/2021   AST 29 02/16/2021   ALKPHOS 67 02/16/2021   BILITOT 0.5 12/18/2020   Lab Results  Component Value Date   HGBA1C 5.5 12/18/2020   Lab Results  Component Value Date   INSULIN 8.6 12/18/2020   Lab Results  Component Value Date   TSH 4.790  (H) 04/01/2021   Lab Results  Component Value Date   CHOL 165 02/16/2021   HDL 106 (A) 02/16/2021   LDLCALC 94 02/16/2021   TRIG 62 02/16/2021   CHOLHDL 2.9 12/18/2020   Lab Results  Component Value Date   WBC 5.9 12/18/2020   HGB 14.9 12/18/2020   HCT 42.6 12/18/2020   MCV 91 12/18/2020   PLT 280 12/18/2020   Attestation Statements:   Reviewed by clinician on day of visit: allergies, medications, problem list, medical history, surgical history, family history, social history, and previous encounter notes.  I, Insurance claims handler, CMA, am acting as Energy manager for [employer], DO.  I have reviewed the above documentation for accuracy and completeness, and I agree with the above. -Carlye Grippe, D.O.  The 21st Century Cures Act was signed into law in 2016 which includes the topic of electronic health records.  This provides immediate access to information in MyChart.  This includes consultation notes, operative notes, office notes, lab results and pathology reports.  If you have any questions about what you read please let us know at your next visit so we can discuss your concerns and take corrective action if need be.  We are right here with you.

## 2021-05-27 ENCOUNTER — Encounter (INDEPENDENT_AMBULATORY_CARE_PROVIDER_SITE_OTHER): Payer: Self-pay

## 2021-05-27 ENCOUNTER — Encounter (INDEPENDENT_AMBULATORY_CARE_PROVIDER_SITE_OTHER): Payer: Self-pay | Admitting: Family Medicine

## 2021-05-27 NOTE — Telephone Encounter (Signed)
Please review

## 2021-05-27 NOTE — Telephone Encounter (Signed)
Abstracted to her chart. Calen Posch, CMA

## 2021-06-03 ENCOUNTER — Ambulatory Visit (INDEPENDENT_AMBULATORY_CARE_PROVIDER_SITE_OTHER): Payer: BC Managed Care – PPO | Admitting: Family Medicine

## 2021-06-03 ENCOUNTER — Other Ambulatory Visit: Payer: Self-pay

## 2021-06-03 ENCOUNTER — Encounter (INDEPENDENT_AMBULATORY_CARE_PROVIDER_SITE_OTHER): Payer: Self-pay | Admitting: Family Medicine

## 2021-06-03 VITALS — BP 126/83 | HR 64 | Ht 60.0 in | Wt 189.0 lb

## 2021-06-03 DIAGNOSIS — Z6839 Body mass index (BMI) 39.0-39.9, adult: Secondary | ICD-10-CM

## 2021-06-03 DIAGNOSIS — Z9189 Other specified personal risk factors, not elsewhere classified: Secondary | ICD-10-CM

## 2021-06-03 DIAGNOSIS — E559 Vitamin D deficiency, unspecified: Secondary | ICD-10-CM | POA: Diagnosis not present

## 2021-06-03 MED ORDER — VITAMIN D (ERGOCALCIFEROL) 1.25 MG (50000 UNIT) PO CAPS: 50000.0000 [IU] | ORAL_CAPSULE | ORAL | 0 refills | Status: AC

## 2021-06-04 ENCOUNTER — Encounter (INDEPENDENT_AMBULATORY_CARE_PROVIDER_SITE_OTHER): Payer: Self-pay | Admitting: Family Medicine

## 2021-06-04 NOTE — Telephone Encounter (Signed)
Last OV with Dr Opalski 

## 2021-06-08 NOTE — Telephone Encounter (Signed)
Is it okay to give her a copy?

## 2021-06-10 NOTE — Progress Notes (Signed)
Chief Complaint:   OBESITY Sylvia Gonzales is here to discuss her progress with her obesity treatment plan along with follow-up of her obesity related diagnoses.   Today's visit was #: 10 Starting weight: 203 lbs Starting date: 12/18/2020 Today's weight: 189 lbs Today's date: 06/03/2021 Weight change since last visit: 2 lbs Total lbs lost to date: 14 lbs Body mass index is 36.91 kg/m.  Total weight loss percentage to date: -6.90%  Interim History: Sylvia Gonzales says she did not like journaling, but learned a lot.  She was not getting in enough protein 50% of the time.  She was not weighing protein.  She has been increasing her walking speed and distance slightly.  Plan:  Weigh proteins, measure vegetables, and please journal on weekends to see how many calories, etc., we are eating.  Current Meal Plan: the Category 1 Plan or keeping a food journal and adhering to recommended goals of 1000-1100 calories and 80+ grams of protein for 70% of the time.  Current Exercise Plan: Walking, yoga, strength training for 50 minutes 5 times per week.  Assessment/Plan:   Medications Discontinued During This Encounter  Medication Reason   Vitamin D, Ergocalciferol, (DRISDOL) 1.25 MG (50000 UNIT) CAPS capsule Reorder    Meds ordered this encounter  Medications   Vitamin D, Ergocalciferol, (DRISDOL) 1.25 MG (50000 UNIT) CAPS capsule    Sig: Take 1 capsule (50,000 Units total) by mouth every 7 (seven) days for 28 days.    Dispense:  4 capsule    Refill:  0    Refills only with f/up OV's   1. Vitamin D deficiency At goal.  She is taking vitamin D 50,000 IU weekly.  Tolerating well, without issues.  Good compliance.   Plan: Continue to take prescription Vitamin D @50 ,000 IU every week as prescribed.  Follow-up for routine testing of Vitamin D, at least 2-3 times per year to avoid over-replacement.  Lab Results  Component Value Date   VD25OH 61.0 04/01/2021   VD25OH 35.7 12/18/2020   - Refill Vitamin D,  Ergocalciferol, (DRISDOL) 1.25 MG (50000 UNIT) CAPS capsule; Take 1 capsule (50,000 Units total) by mouth every 7 (seven) days for 28 days.  Dispense: 4 capsule; Refill: 0  2. At risk for malnutrition Sylvia Gonzales was given extensive malnutrition prevention education and counseling today of more than 8 minutes.  She is at risk for malnutrition due to poor food choices on weekends.  Counseled her that malnutrition refers to inappropriate nutrients or not the right balance of nutrients for optimal health.  Discussed with Sylvia Gonzales that it is absolutely possible to be malnourished but yet obese.  Risk factors, including but not limited to, inappropriate dietary choices, difficulty with obtaining food due to physical or financial limitations, and various physical and mental health conditions were reviewed with Sylvia Gonzales.    3. Class 2 severe obesity with serious comorbidity and body mass index (BMI) of 39.0 to 39.9 in adult, unspecified obesity type (HCC)  Course: Sylvia Gonzales is currently in the action stage of change. As such, her goal is to continue with weight loss efforts.   Nutrition goals: She has agreed to the Category 1 Plan and/or keeping a food journal and adhering to recommended goals of 1000-1100 calories and 80+ grams of protein daily.   Exercise goals:  As is.  Behavioral modification strategies: decreasing eating out on weekends, travel eating strategies on motorcycle for weekend trips, and planning for success.  Sylvia Gonzales has agreed to follow-up with  our clinic in 3-4 weeks as they will be in [place] for husband's job at [employer] over the next 3 months. She was informed of the importance of frequent follow-up visits to maximize her success with intensive lifestyle modifications for her multiple health conditions.   Objective:   Blood pressure 126/83, pulse 64, height 5' (1.524 m), weight 189 lb (85.7 kg), SpO2 95 %. Body mass index is 36.91 kg/m.  General: Cooperative, alert, well developed, in no  acute distress. HEENT: Conjunctivae and lids unremarkable. Cardiovascular: Regular rhythm.  Lungs: Normal work of breathing. Neurologic: No focal deficits.   Lab Results  Component Value Date   CREATININE 0.8 02/16/2021   BUN 13 02/16/2021   NA 142 02/16/2021   K 4.8 02/16/2021   CL 106 02/16/2021   CO2 30 (A) 02/16/2021   Lab Results  Component Value Date   ALT 30 02/16/2021   AST 29 02/16/2021   ALKPHOS 67 02/16/2021   BILITOT 0.5 12/18/2020   Lab Results  Component Value Date   HGBA1C 5.5 12/18/2020   Lab Results  Component Value Date   INSULIN 8.6 12/18/2020   Lab Results  Component Value Date   TSH 0.84 05/25/2021   Lab Results  Component Value Date   CHOL 165 02/16/2021   HDL 106 (A) 02/16/2021   LDLCALC 94 02/16/2021   TRIG 62 02/16/2021   CHOLHDL 2.9 12/18/2020   Lab Results  Component Value Date   VD25OH 61.0 04/01/2021   VD25OH 35.7 12/18/2020   Lab Results  Component Value Date   WBC 5.9 12/18/2020   HGB 14.9 12/18/2020   HCT 42.6 12/18/2020   MCV 91 12/18/2020   PLT 280 12/18/2020   Attestation Statements:   Reviewed by clinician on day of visit: allergies, medications, problem list, medical history, surgical history, family history, social history, and previous encounter notes.  I, Insurance claims handler, CMA, am acting as Energy manager for [employer], DO.  I have reviewed the above documentation for accuracy and completeness, and I agree with the above. Carlye Grippe, D.O.  The 21st Century Cures Act was signed into law in 2016 which includes the topic of electronic health records.  This provides immediate access to information in MyChart.  This includes consultation notes, operative notes, office notes, lab results and pathology reports.  If you have any questions about what you read please let us know at your next visit so we can discuss your concerns and take corrective action if need be.  We are right here with you.

## 2021-06-17 ENCOUNTER — Encounter (INDEPENDENT_AMBULATORY_CARE_PROVIDER_SITE_OTHER): Payer: Self-pay | Admitting: Family Medicine

## 2021-06-17 NOTE — Telephone Encounter (Signed)
Dr.Opalski ?

## 2021-07-06 DIAGNOSIS — L2084 Intrinsic (allergic) eczema: Secondary | ICD-10-CM | POA: Diagnosis not present

## 2021-07-06 DIAGNOSIS — L300 Nummular dermatitis: Secondary | ICD-10-CM | POA: Diagnosis not present

## 2021-07-06 DIAGNOSIS — D1801 Hemangioma of skin and subcutaneous tissue: Secondary | ICD-10-CM | POA: Diagnosis not present

## 2021-07-08 ENCOUNTER — Ambulatory Visit (INDEPENDENT_AMBULATORY_CARE_PROVIDER_SITE_OTHER): Payer: BC Managed Care – PPO | Admitting: Family Medicine

## 2021-07-17 ENCOUNTER — Other Ambulatory Visit (INDEPENDENT_AMBULATORY_CARE_PROVIDER_SITE_OTHER): Payer: Self-pay | Admitting: Family Medicine

## 2021-07-17 DIAGNOSIS — E559 Vitamin D deficiency, unspecified: Secondary | ICD-10-CM

## 2021-07-20 NOTE — Telephone Encounter (Signed)
Dr.Opalski ?

## 2021-09-16 DIAGNOSIS — E039 Hypothyroidism, unspecified: Secondary | ICD-10-CM | POA: Diagnosis not present

## 2021-09-21 DIAGNOSIS — E039 Hypothyroidism, unspecified: Secondary | ICD-10-CM | POA: Diagnosis not present

## 2021-09-21 DIAGNOSIS — I10 Essential (primary) hypertension: Secondary | ICD-10-CM | POA: Diagnosis not present

## 2021-09-21 DIAGNOSIS — E669 Obesity, unspecified: Secondary | ICD-10-CM | POA: Diagnosis not present

## 2021-09-21 DIAGNOSIS — E785 Hyperlipidemia, unspecified: Secondary | ICD-10-CM | POA: Diagnosis not present

## 2021-12-18 DIAGNOSIS — Z20822 Contact with and (suspected) exposure to covid-19: Secondary | ICD-10-CM | POA: Diagnosis not present

## 2022-02-22 DIAGNOSIS — I10 Essential (primary) hypertension: Secondary | ICD-10-CM | POA: Diagnosis not present

## 2022-02-22 DIAGNOSIS — E039 Hypothyroidism, unspecified: Secondary | ICD-10-CM | POA: Diagnosis not present

## 2022-02-22 DIAGNOSIS — E785 Hyperlipidemia, unspecified: Secondary | ICD-10-CM | POA: Diagnosis not present

## 2022-03-04 DIAGNOSIS — Z0001 Encounter for general adult medical examination with abnormal findings: Secondary | ICD-10-CM | POA: Diagnosis not present

## 2022-03-04 DIAGNOSIS — E039 Hypothyroidism, unspecified: Secondary | ICD-10-CM | POA: Diagnosis not present

## 2022-03-04 DIAGNOSIS — I10 Essential (primary) hypertension: Secondary | ICD-10-CM | POA: Diagnosis not present

## 2022-03-04 DIAGNOSIS — E785 Hyperlipidemia, unspecified: Secondary | ICD-10-CM | POA: Diagnosis not present

## 2022-03-08 ENCOUNTER — Other Ambulatory Visit: Payer: Self-pay | Admitting: Family Medicine

## 2022-03-08 DIAGNOSIS — Z1231 Encounter for screening mammogram for malignant neoplasm of breast: Secondary | ICD-10-CM

## 2022-04-19 ENCOUNTER — Ambulatory Visit
Admission: RE | Admit: 2022-04-19 | Discharge: 2022-04-19 | Disposition: A | Discharge status: Home / Self Care | Payer: BC Managed Care – PPO | Source: Ambulatory Visit | Attending: Family Medicine | Admitting: Family Medicine

## 2022-04-19 DIAGNOSIS — Z1231 Encounter for screening mammogram for malignant neoplasm of breast: Secondary | ICD-10-CM

## 2022-04-20 ENCOUNTER — Other Ambulatory Visit: Payer: Self-pay | Admitting: Family Medicine

## 2022-04-20 DIAGNOSIS — R928 Other abnormal and inconclusive findings on diagnostic imaging of breast: Secondary | ICD-10-CM

## 2022-05-05 ENCOUNTER — Other Ambulatory Visit: Payer: BC Managed Care – PPO

## 2022-05-25 DIAGNOSIS — I10 Essential (primary) hypertension: Secondary | ICD-10-CM | POA: Diagnosis not present

## 2022-05-25 DIAGNOSIS — E785 Hyperlipidemia, unspecified: Secondary | ICD-10-CM | POA: Diagnosis not present

## 2022-05-25 DIAGNOSIS — E669 Obesity, unspecified: Secondary | ICD-10-CM | POA: Diagnosis not present

## 2022-05-27 ENCOUNTER — Ambulatory Visit
Admission: RE | Admit: 2022-05-27 | Discharge: 2022-05-27 | Disposition: A | Discharge status: Home / Self Care | Payer: BC Managed Care – PPO | Source: Ambulatory Visit | Attending: Family Medicine | Admitting: Family Medicine

## 2022-05-27 ENCOUNTER — Ambulatory Visit: Payer: BC Managed Care – PPO

## 2022-05-27 DIAGNOSIS — R928 Other abnormal and inconclusive findings on diagnostic imaging of breast: Secondary | ICD-10-CM | POA: Diagnosis not present

## 2022-06-21 DIAGNOSIS — M25552 Pain in left hip: Secondary | ICD-10-CM | POA: Diagnosis not present

## 2022-06-21 DIAGNOSIS — M79645 Pain in left finger(s): Secondary | ICD-10-CM | POA: Diagnosis not present

## 2022-06-21 DIAGNOSIS — M25532 Pain in left wrist: Secondary | ICD-10-CM | POA: Diagnosis not present

## 2022-06-21 DIAGNOSIS — M25551 Pain in right hip: Secondary | ICD-10-CM | POA: Diagnosis not present

## 2022-07-21 ENCOUNTER — Encounter (INDEPENDENT_AMBULATORY_CARE_PROVIDER_SITE_OTHER): Payer: Self-pay

## 2022-09-02 DIAGNOSIS — E785 Hyperlipidemia, unspecified: Secondary | ICD-10-CM | POA: Diagnosis not present

## 2022-09-02 DIAGNOSIS — E039 Hypothyroidism, unspecified: Secondary | ICD-10-CM | POA: Diagnosis not present

## 2022-09-29 DIAGNOSIS — Z79899 Other long term (current) drug therapy: Secondary | ICD-10-CM | POA: Diagnosis not present

## 2023-03-04 DIAGNOSIS — E039 Hypothyroidism, unspecified: Secondary | ICD-10-CM | POA: Diagnosis not present

## 2023-03-10 DIAGNOSIS — E039 Hypothyroidism, unspecified: Secondary | ICD-10-CM | POA: Diagnosis not present

## 2023-03-10 DIAGNOSIS — Z0001 Encounter for general adult medical examination with abnormal findings: Secondary | ICD-10-CM | POA: Diagnosis not present

## 2023-03-10 DIAGNOSIS — E785 Hyperlipidemia, unspecified: Secondary | ICD-10-CM | POA: Diagnosis not present

## 2023-03-10 DIAGNOSIS — I10 Essential (primary) hypertension: Secondary | ICD-10-CM | POA: Diagnosis not present

## 2023-03-11 ENCOUNTER — Other Ambulatory Visit: Payer: Self-pay | Admitting: Family Medicine

## 2023-03-11 DIAGNOSIS — E2839 Other primary ovarian failure: Secondary | ICD-10-CM

## 2023-03-16 ENCOUNTER — Other Ambulatory Visit: Payer: Self-pay | Admitting: Family Medicine

## 2023-03-16 DIAGNOSIS — Z1231 Encounter for screening mammogram for malignant neoplasm of breast: Secondary | ICD-10-CM

## 2023-03-28 DIAGNOSIS — R262 Difficulty in walking, not elsewhere classified: Secondary | ICD-10-CM | POA: Diagnosis not present

## 2023-03-28 DIAGNOSIS — M25552 Pain in left hip: Secondary | ICD-10-CM | POA: Diagnosis not present

## 2023-03-28 DIAGNOSIS — M25551 Pain in right hip: Secondary | ICD-10-CM | POA: Diagnosis not present

## 2023-03-31 DIAGNOSIS — M25551 Pain in right hip: Secondary | ICD-10-CM | POA: Diagnosis not present

## 2023-03-31 DIAGNOSIS — R262 Difficulty in walking, not elsewhere classified: Secondary | ICD-10-CM | POA: Diagnosis not present

## 2023-03-31 DIAGNOSIS — M25552 Pain in left hip: Secondary | ICD-10-CM | POA: Diagnosis not present

## 2023-04-07 DIAGNOSIS — M25552 Pain in left hip: Secondary | ICD-10-CM | POA: Diagnosis not present

## 2023-04-07 DIAGNOSIS — R262 Difficulty in walking, not elsewhere classified: Secondary | ICD-10-CM | POA: Diagnosis not present

## 2023-04-07 DIAGNOSIS — M25551 Pain in right hip: Secondary | ICD-10-CM | POA: Diagnosis not present

## 2023-04-12 DIAGNOSIS — L280 Lichen simplex chronicus: Secondary | ICD-10-CM | POA: Diagnosis not present

## 2023-04-12 DIAGNOSIS — L2089 Other atopic dermatitis: Secondary | ICD-10-CM | POA: Diagnosis not present

## 2023-04-14 DIAGNOSIS — M25552 Pain in left hip: Secondary | ICD-10-CM | POA: Diagnosis not present

## 2023-04-14 DIAGNOSIS — R262 Difficulty in walking, not elsewhere classified: Secondary | ICD-10-CM | POA: Diagnosis not present

## 2023-04-14 DIAGNOSIS — M25551 Pain in right hip: Secondary | ICD-10-CM | POA: Diagnosis not present

## 2023-05-30 ENCOUNTER — Ambulatory Visit
Admission: RE | Admit: 2023-05-30 | Discharge: 2023-05-30 | Disposition: A | Discharge status: Home / Self Care | Payer: BC Managed Care – PPO | Source: Ambulatory Visit | Attending: Family Medicine | Admitting: Family Medicine

## 2023-05-30 DIAGNOSIS — Z1231 Encounter for screening mammogram for malignant neoplasm of breast: Secondary | ICD-10-CM | POA: Diagnosis not present

## 2023-07-12 DIAGNOSIS — L821 Other seborrheic keratosis: Secondary | ICD-10-CM | POA: Diagnosis not present

## 2023-07-12 DIAGNOSIS — L814 Other melanin hyperpigmentation: Secondary | ICD-10-CM | POA: Diagnosis not present

## 2023-07-12 DIAGNOSIS — L2089 Other atopic dermatitis: Secondary | ICD-10-CM | POA: Diagnosis not present

## 2023-07-12 DIAGNOSIS — L570 Actinic keratosis: Secondary | ICD-10-CM | POA: Diagnosis not present

## 2023-07-12 DIAGNOSIS — D225 Melanocytic nevi of trunk: Secondary | ICD-10-CM | POA: Diagnosis not present

## 2023-08-09 DIAGNOSIS — S90416A Abrasion, unspecified lesser toe(s), initial encounter: Secondary | ICD-10-CM | POA: Diagnosis not present

## 2023-09-09 DIAGNOSIS — I10 Essential (primary) hypertension: Secondary | ICD-10-CM | POA: Diagnosis not present

## 2023-09-09 DIAGNOSIS — E785 Hyperlipidemia, unspecified: Secondary | ICD-10-CM | POA: Diagnosis not present

## 2023-09-09 DIAGNOSIS — E039 Hypothyroidism, unspecified: Secondary | ICD-10-CM | POA: Diagnosis not present

## 2023-09-15 DIAGNOSIS — E039 Hypothyroidism, unspecified: Secondary | ICD-10-CM | POA: Diagnosis not present

## 2023-09-15 DIAGNOSIS — E785 Hyperlipidemia, unspecified: Secondary | ICD-10-CM | POA: Diagnosis not present

## 2023-09-15 DIAGNOSIS — I10 Essential (primary) hypertension: Secondary | ICD-10-CM | POA: Diagnosis not present

## 2023-09-15 DIAGNOSIS — Z23 Encounter for immunization: Secondary | ICD-10-CM | POA: Diagnosis not present

## 2023-09-16 ENCOUNTER — Ambulatory Visit
Admission: RE | Admit: 2023-09-16 | Discharge: 2023-09-16 | Disposition: A | Discharge status: Home / Self Care | Payer: BC Managed Care – PPO | Source: Ambulatory Visit | Attending: Family Medicine | Admitting: Family Medicine

## 2023-09-16 DIAGNOSIS — E2839 Other primary ovarian failure: Secondary | ICD-10-CM

## 2023-09-16 DIAGNOSIS — N958 Other specified menopausal and perimenopausal disorders: Secondary | ICD-10-CM | POA: Diagnosis not present

## 2023-09-16 DIAGNOSIS — E349 Endocrine disorder, unspecified: Secondary | ICD-10-CM | POA: Diagnosis not present

## 2023-09-20 DIAGNOSIS — E6609 Other obesity due to excess calories: Secondary | ICD-10-CM | POA: Diagnosis not present

## 2023-09-20 DIAGNOSIS — I10 Essential (primary) hypertension: Secondary | ICD-10-CM | POA: Diagnosis not present

## 2023-09-20 DIAGNOSIS — Z6838 Body mass index (BMI) 38.0-38.9, adult: Secondary | ICD-10-CM | POA: Diagnosis not present

## 2023-09-20 DIAGNOSIS — E785 Hyperlipidemia, unspecified: Secondary | ICD-10-CM | POA: Diagnosis not present

## 2024-01-22 IMAGING — MG MM DIGITAL DIAGNOSTIC UNILAT*L* W/ TOMO W/ CAD
8 series · 8 of 24 positions shown · non-contrast
Comparison: Previous exam(s).

CLINICAL DATA: Recall from screening mammography, possible focal
asymmetry involving the outer LEFT breast at middle to posterior
depth.

EXAM:
DIGITAL DIAGNOSTIC UNILATERAL LEFT MAMMOGRAM WITH TOMOSYNTHESIS AND
CAD
TECHNIQUE: Left digital diagnostic mammography and breast tomosynthesis was
performed. The images were evaluated with computer-aided detection.

[L ML synth-2D]
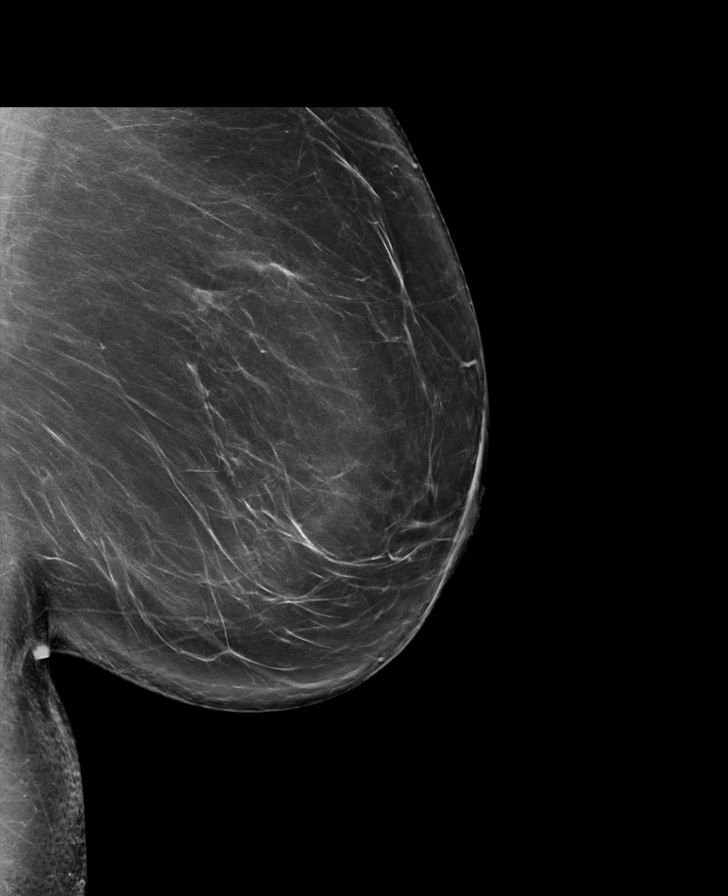

[L CC synth-2D (1 of 2)]
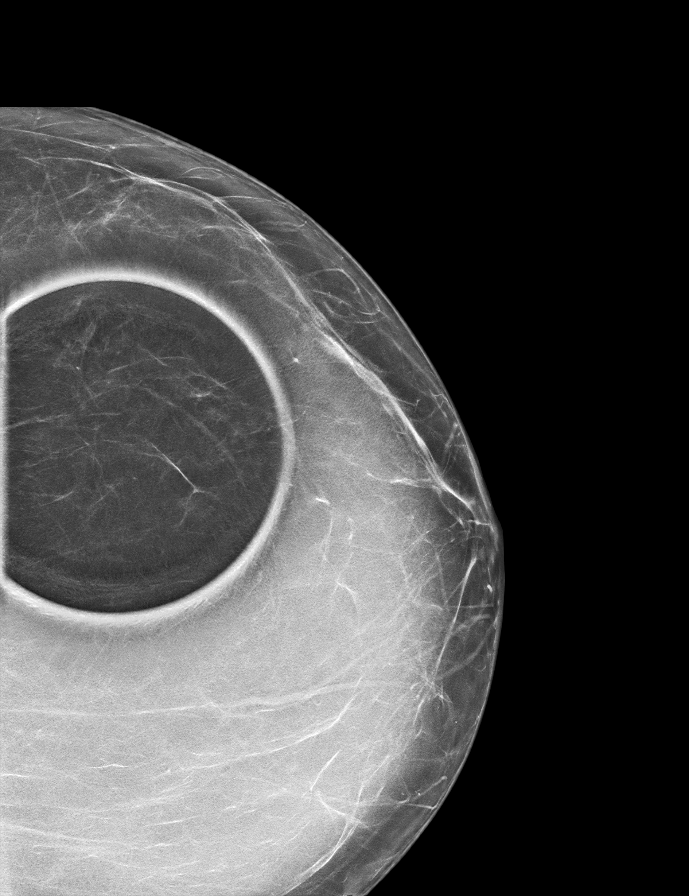

[L CC synth-2D (2 of 2)]
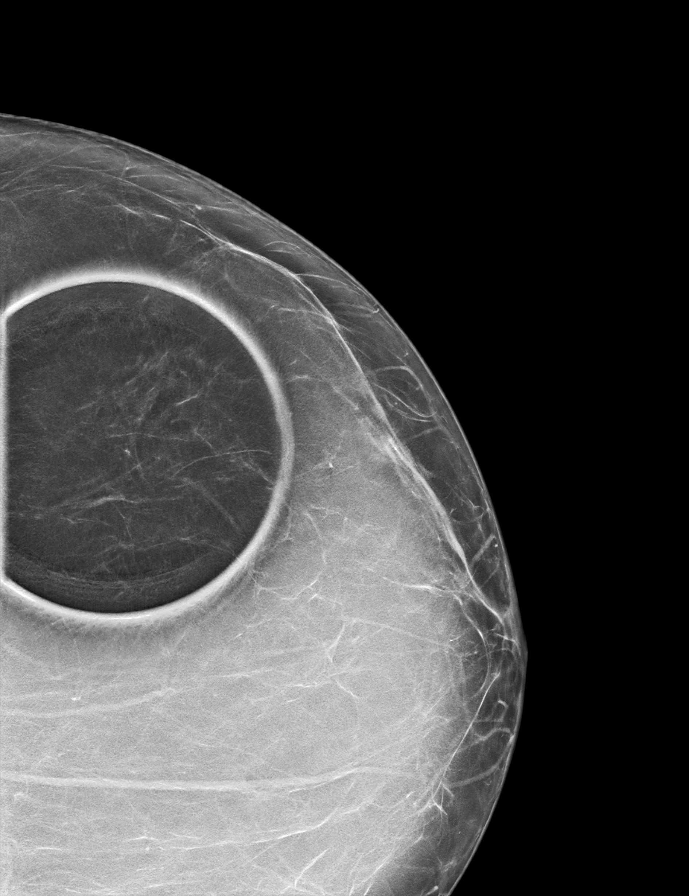

[L MLO synth-2D]
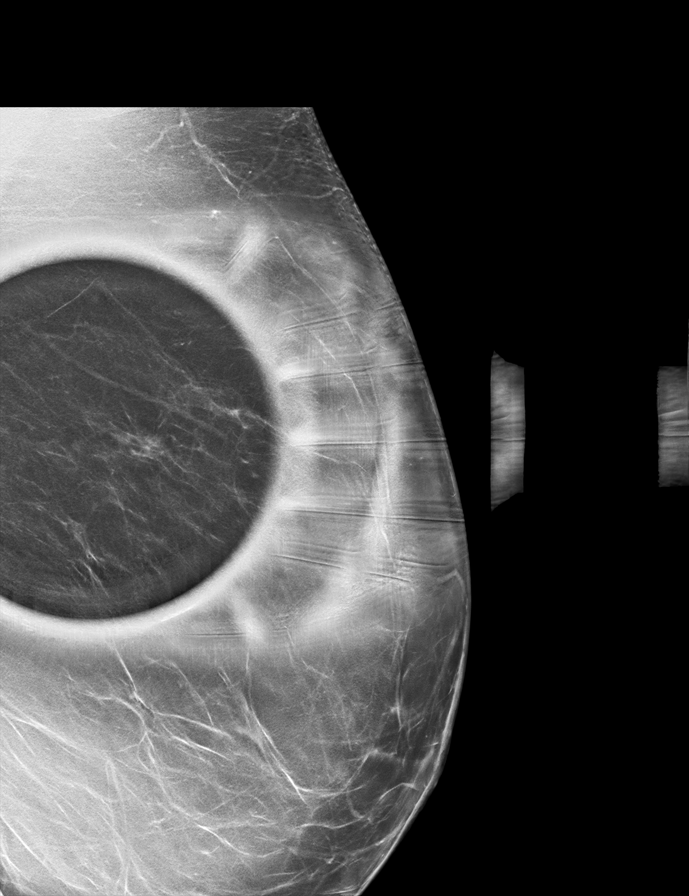

[L CC tomo (1 of 2) · tomo slice 32/63.0]
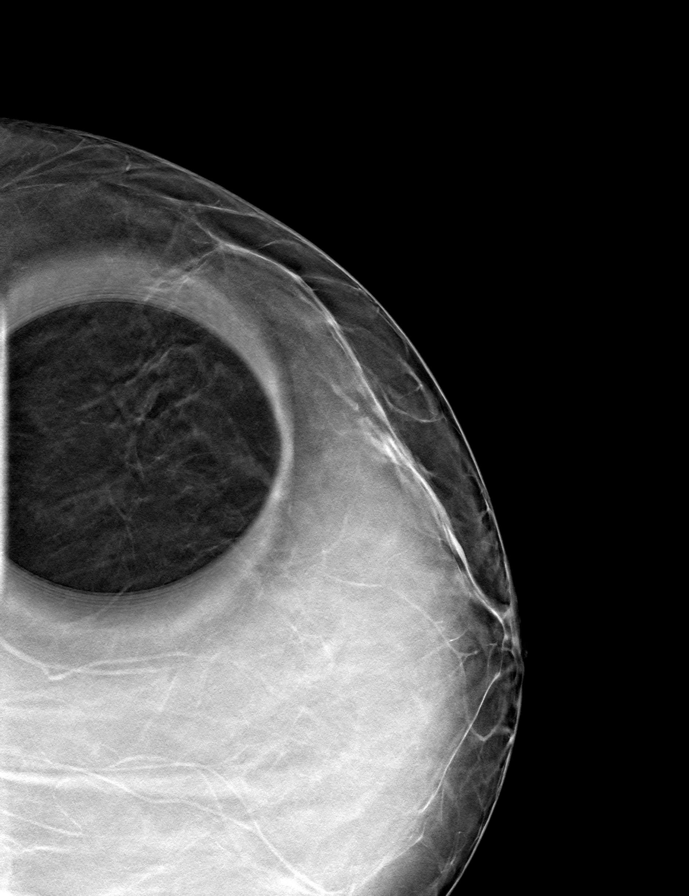

[L MLO tomo · tomo slice 37/74.0]
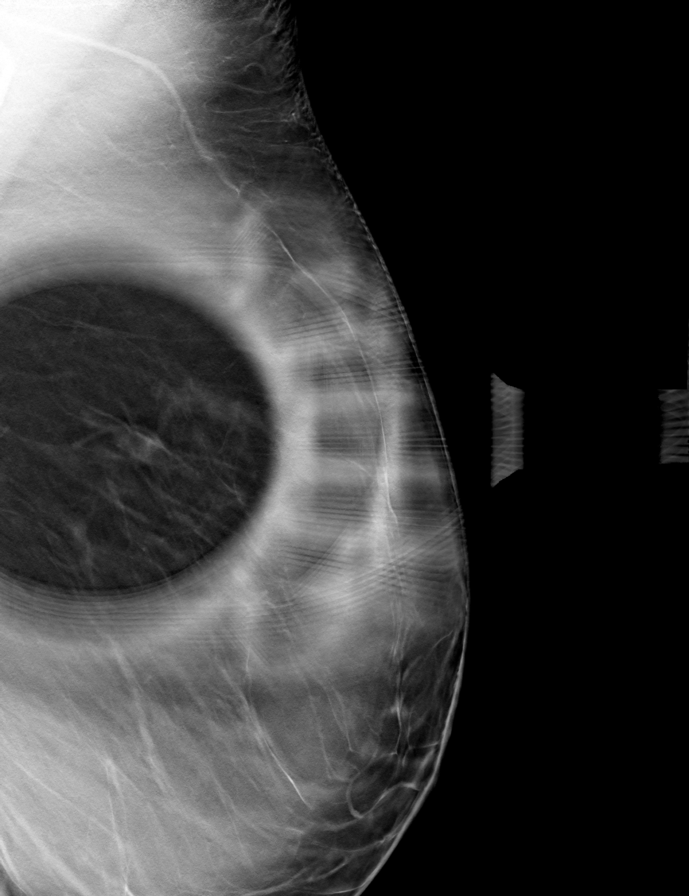

[L ML tomo · tomo slice 49/97.0]
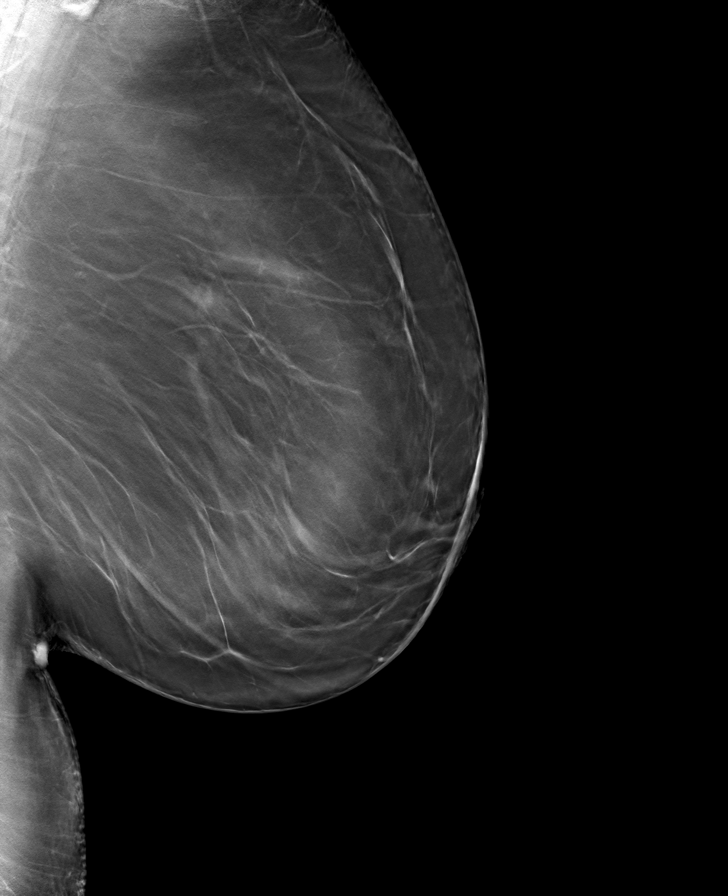

[L CC tomo (2 of 2) · tomo slice 33/64.0]
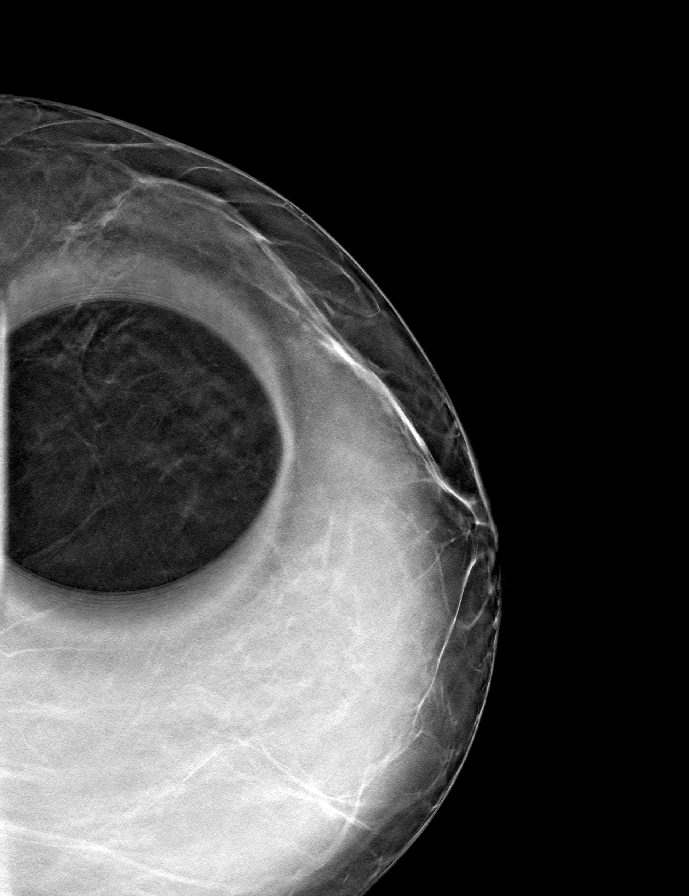

[8 of 24 positions shown; findings below may reference images not displayed]

ACR Breast Density Category b: There are scattered areas of
fibroglandular density.
FINDINGS: Spot-compression CC and MLO views of the area of concern and a full
field mediolateral view were obtained.

The focal asymmetry in the outer breast at middle to posterior depth
persists but disperses with compression, indicating overlapping
fibroglandular tissue and Cooper's ligaments. There is no underlying
mass or architectural distortion. This is confirmed on the full
field mediolateral image.

No findings suspicious for malignancy.
IMPRESSION: No mammographic evidence of malignancy involving the LEFT breast.

RECOMMENDATION:
Screening mammogram in one year.(Code:FS-6-TZU)

I have discussed the findings and recommendations with the patient.
If applicable, a reminder letter will be sent to the patient
regarding the next appointment.

BI-RADS CATEGORY  1: Negative.
